# Patient Record
Sex: Male | Born: 1970 | Race: White | Marital: Married | Smoking: Never smoker
Health system: Southern US, Community
[De-identification: ages and names within clinical notes are randomized; demographics above are authoritative.]

## PROBLEM LIST (undated history)

## (undated) DIAGNOSIS — E785 Hyperlipidemia, unspecified: Secondary | ICD-10-CM

## (undated) DIAGNOSIS — M1990 Unspecified osteoarthritis, unspecified site: Secondary | ICD-10-CM

## (undated) DIAGNOSIS — K219 Gastro-esophageal reflux disease without esophagitis: Secondary | ICD-10-CM

## (undated) HISTORY — DX: Unspecified osteoarthritis, unspecified site: M19.90

## (undated) HISTORY — DX: Gastro-esophageal reflux disease without esophagitis: K21.9

## (undated) HISTORY — PX: OTHER SURGICAL HISTORY: SHX169

## (undated) HISTORY — PX: KNEE ARTHROCENTESIS: SUR44

## (undated) HISTORY — DX: Hyperlipidemia, unspecified: E78.5

---

## 2012-05-23 DIAGNOSIS — Z800 Family history of malignant neoplasm of digestive organs: Secondary | ICD-10-CM | POA: Insufficient documentation

## 2012-05-23 DIAGNOSIS — Z8719 Personal history of other diseases of the digestive system: Secondary | ICD-10-CM | POA: Insufficient documentation

## 2012-05-23 DIAGNOSIS — E785 Hyperlipidemia, unspecified: Secondary | ICD-10-CM | POA: Insufficient documentation

## 2012-05-23 DIAGNOSIS — Z833 Family history of diabetes mellitus: Secondary | ICD-10-CM | POA: Insufficient documentation

## 2012-05-23 DIAGNOSIS — Z8249 Family history of ischemic heart disease and other diseases of the circulatory system: Secondary | ICD-10-CM | POA: Insufficient documentation

## 2012-05-23 DIAGNOSIS — Z9889 Other specified postprocedural states: Secondary | ICD-10-CM | POA: Insufficient documentation

## 2013-03-14 HISTORY — PX: VASECTOMY: SHX75

## 2014-05-05 LAB — HM COLONOSCOPY

## 2015-06-04 ENCOUNTER — Ambulatory Visit (INDEPENDENT_AMBULATORY_CARE_PROVIDER_SITE_OTHER): Payer: 59 | Admitting: Family Medicine

## 2015-06-04 ENCOUNTER — Encounter: Payer: Self-pay | Admitting: Family Medicine

## 2015-06-04 VITALS — BP 128/76 | HR 57 | Temp 97.9°F | Resp 16 | Ht 72.0 in | Wt 192.0 lb

## 2015-06-04 DIAGNOSIS — Z7189 Other specified counseling: Secondary | ICD-10-CM

## 2015-06-04 DIAGNOSIS — J0111 Acute recurrent frontal sinusitis: Secondary | ICD-10-CM | POA: Diagnosis not present

## 2015-06-04 DIAGNOSIS — E785 Hyperlipidemia, unspecified: Secondary | ICD-10-CM

## 2015-06-04 DIAGNOSIS — Z800 Family history of malignant neoplasm of digestive organs: Secondary | ICD-10-CM | POA: Diagnosis not present

## 2015-06-04 DIAGNOSIS — Z125 Encounter for screening for malignant neoplasm of prostate: Secondary | ICD-10-CM | POA: Diagnosis not present

## 2015-06-04 DIAGNOSIS — Z7689 Persons encountering health services in other specified circumstances: Secondary | ICD-10-CM

## 2015-06-04 MED ORDER — LEVOFLOXACIN 500 MG PO TABS: 500.0000 mg | ORAL_TABLET | Freq: Every day | ORAL | Status: DC

## 2015-06-04 MED ORDER — PSEUDOEPHEDRINE HCL 60 MG PO TABS: 60.0000 mg | ORAL_TABLET | Freq: Three times a day (TID) | ORAL | Status: DC | PRN

## 2015-06-04 MED ORDER — FLUTICASONE PROPIONATE 50 MCG/ACT NA SUSP: 2.0000 | Freq: Every day | NASAL | Status: DC

## 2015-06-04 NOTE — Patient Instructions (Addendum)
You can use supportive care at home to help with your symptoms. I have sent Mucinex DM to your pharmacy to help break up the congestion and soothe your cough. You can takes this twice daily.  Honey is a natural cough suppressant- so add it to your tea in the morning.  If you have a humidifer, set that up in your bedroom at night.   Take levaquin once daily for 5 days to help with sinus infection. Use mucinex and sudafed to help with your ear.

## 2015-06-04 NOTE — Assessment & Plan Note (Signed)
Diet controlled. Encouraged heart healthy diet and exercise. Recheck lipid panel.

## 2015-06-04 NOTE — Progress Notes (Signed)
Subjective:    Patient ID: Chris Petersen, male    DOB: 1970/06/24, 45 y.o.   MRN: [id number]  HPI: Chris Petersen is a 45 y.o. male presenting on 06/04/2015 for Establish Care   HPI  Pt presents to establish care today. Previous care provider was Therapist, occupational in [place].  It has been 1 year year since His last PCP visit. Records from previous provider will be requested and reviewed. Current medical problems include:  Acid reflux: Occasionl- takes OTC omeprazole Mild hyperlipidemia-  Recent treatment for a sinus infection and ear infection- put on amoxil for since infection- day 6.- Fast Med on Saturday. Had started Z-pak prior. Taking mucinex with little relief. Tried sudafed no relief. Still pain an pressure behind the eyes. Sinus HA. Ear still feels full.   Health maintenance:  Colonoscopy- Dad had colon cancer- dx age 51. Colonoscopy normal 3 years due to family history has screened early.  No family history prostate cancer. But would like to screen today.  Last TDAp - 3 years ago.  Flu shot done this year.  Exercises regularly- races motorcycles. Has a 44 year old.  Works as Adult nurse.    Past Medical History  Diagnosis Date  . GERD (gastroesophageal reflux disease)   . Hyperlipidemia   . Arthritis    Past Surgical History  Procedure Laterality Date  . Knee arthrocentesis      L Knee ( MCL, ACL,PCL ) tear  . Hand accident      Social History   Social History  . Marital Status: Married    Spouse Name: N/A  . Number of Children: N/A  . Years of Education: N/A   Occupational History  . Not on file.   Social History Main Topics  . Smoking status: Never Smoker   . Smokeless tobacco: Not on file  . Alcohol Use: No  . Drug Use: Yes  . Sexual Activity: Not on file   Other Topics Concern  . Not on file   Social History Narrative  . No narrative on file   Family History  Problem Relation Age of Onset  . Cancer Father   . Colon polyps Father     No current outpatient prescriptions on file prior to visit.   No current facility-administered medications on file prior to visit.    Review of Systems  Constitutional: Negative for fever and chills.  HENT: Positive for congestion, ear pain, postnasal drip and sinus pressure.   Respiratory: Negative for chest tightness, shortness of breath and wheezing.   Cardiovascular: Negative for chest pain, palpitations and leg swelling.  Gastrointestinal: Negative for nausea, vomiting and abdominal pain.  Endocrine: Negative.   Genitourinary: Negative for dysuria, urgency, discharge, penile pain and testicular pain.  Musculoskeletal: Negative for back pain, joint swelling and arthralgias.  Skin: Negative.   Neurological: Negative for dizziness, weakness, numbness and headaches.  Psychiatric/Behavioral: Negative for sleep disturbance and dysphoric mood.   Per HPI unless specifically indicated above     Objective:    BP 128/76 mmHg  Pulse 57  Temp(Src) 97.9 F (36.6 C) (Oral)  Resp 16  Ht 6' (1.829 m)  Wt 192 lb (87.091 kg)  BMI 26.03 kg/m2  Wt Readings from Last 3 Encounters:  06/04/15 192 lb (87.091 kg)    Physical Exam  Constitutional: He is oriented to person, place, and time. He appears well-developed and well-nourished. No distress.  HENT:  Head: Normocephalic and atraumatic.  Right Ear: Hearing and tympanic membrane normal.  Left Ear: Hearing normal. A middle ear effusion is present.  Nose: Mucosal edema and rhinorrhea present. Right sinus exhibits frontal sinus tenderness. Left sinus exhibits frontal sinus tenderness.  Mouth/Throat: Mucous membranes are normal. Posterior oropharyngeal erythema present.  Neck: Neck supple. No thyromegaly present.  Cardiovascular: Normal rate, regular rhythm and normal heart sounds.  Exam reveals no gallop and no friction rub.   No murmur heard. Pulmonary/Chest: Effort normal and breath sounds normal. He has no wheezes.  Abdominal: Soft.  Bowel sounds are normal. He exhibits no distension. There is no tenderness. There is no rebound.  Musculoskeletal: Normal range of motion. He exhibits no edema or tenderness.  Neurological: He is alert and oriented to person, place, and time. He has normal reflexes.  Skin: Skin is warm and dry. No rash noted. No erythema.  Psychiatric: He has a normal mood and affect. His behavior is normal. Thought content normal.   No results found for this or any previous visit.    Assessment & Plan:   Problem List Items Addressed This Visit      Other   Family history of colon cancer    Continue screening colonoscopy as directed by GI.       HLD (hyperlipidemia)    Diet controlled. Encouraged heart healthy diet and exercise. Recheck lipid panel.       Relevant Orders   Comprehensive metabolic panel   Lipid Profile    Other Visit Diagnoses    Acute recurrent frontal sinusitis    -  Primary    Change to levaquin since 2 rounds of abx have not cleared symptoms. Trial of sudafed. Add flonase. Return if not improving.     Relevant Medications    pseudoephedrine (SUDAFED) 60 MG tablet    levofloxacin (LEVAQUIN) 500 MG tablet    fluticasone (FLONASE) 50 MCG/ACT nasal spray    Encounter to establish care        Screening for prostate cancer        Reviewed screening guidelines. Pt is very concerned and would like PSA checked. have ordered per patient preference.     Relevant Orders    PSA       Meds ordered this encounter  Medications  . DISCONTD: amoxicillin-clavulanate (AUGMENTIN) 875-125 MG tablet    Sig: Take 1 tablet by mouth 2 (two) times daily.    Refill:  0  . Omega-3 Fatty Acids (FISH OIL CONCENTRATE PO)    Sig: Take by mouth.  . DISCONTD: levofloxacin (LEVAQUIN) 500 MG tablet    Sig: Take 1 tablet (500 mg total) by mouth daily.    Dispense:  5 tablet    Refill:  0    Order Specific Question:  Supervising Provider    Answer:  Arlis Porta [id number]  . DISCONTD:  pseudoephedrine (SUDAFED) 60 MG tablet    Sig: Take 1 tablet (60 mg total) by mouth every 8 (eight) hours as needed for congestion.    Dispense:  30 tablet    Refill:  0    Order Specific Question:  Supervising Provider    Answer:  Arlis Porta [phone]  . pseudoephedrine (SUDAFED) 60 MG tablet    Sig: Take 1 tablet (60 mg total) by mouth every 8 (eight) hours as needed for congestion.    Dispense:  30 tablet    Refill:  0    Order Specific Question:  Supervising Provider    Answer:  Arlis Porta [phone]  .  levofloxacin (LEVAQUIN) 500 MG tablet    Sig: Take 1 tablet (500 mg total) by mouth daily.    Dispense:  5 tablet    Refill:  0    Order Specific Question:  Supervising Provider    Answer:  Arlis Porta [id number]  . fluticasone (FLONASE) 50 MCG/ACT nasal spray    Sig: Place 2 sprays into both nostrils daily.    Dispense:  16 g    Refill:  11    Order Specific Question:  Supervising Provider    Answer:  Arlis Porta [phone]      Follow up plan: No Follow-up on file.

## 2015-06-04 NOTE — Assessment & Plan Note (Signed)
Continue screening colonoscopy as directed by GI.

## 2015-06-17 ENCOUNTER — Telehealth: Payer: Self-pay | Admitting: Family Medicine

## 2015-06-17 DIAGNOSIS — Z3009 Encounter for other general counseling and advice on contraception: Secondary | ICD-10-CM

## 2015-06-17 NOTE — Telephone Encounter (Signed)
Pt needs a referral for vasectomy.  His call back number is [phone]

## 2015-06-18 NOTE — Telephone Encounter (Signed)
Done.[place] 

## 2015-06-18 NOTE — Telephone Encounter (Signed)
Please let him know I have placed a referral to [facility]- they will contact him with appt.

## 2015-06-24 LAB — LIPID PANEL
Chol/HDL Ratio: 3.4 ratio units (ref 0.0–5.0)
Cholesterol, Total: 209 mg/dL — ABNORMAL HIGH (ref 100–199)
HDL: 62 mg/dL (ref >39)
LDL Calculated: 124 mg/dL — ABNORMAL HIGH (ref 0–99)
TRIGLYCERIDES: 114 mg/dL (ref 0–149)
VLDL Cholesterol Cal: 23 mg/dL (ref 5–40)

## 2015-06-24 LAB — COMPREHENSIVE METABOLIC PANEL
A/G RATIO: 1.8 (ref 1.2–2.2)
ALT: 31 IU/L (ref 0–44)
AST: 33 IU/L (ref 0–40)
Albumin: 4.4 g/dL (ref 3.5–5.5)
Alkaline Phosphatase: 51 IU/L (ref 39–117)
BILIRUBIN TOTAL: 0.5 mg/dL (ref 0.0–1.2)
BUN/Creatinine Ratio: 15 (ref 9–20)
BUN: 16 mg/dL (ref 6–24)
CALCIUM: 9.5 mg/dL (ref 8.7–10.2)
CHLORIDE: 100 mmol/L (ref 96–106)
CO2: 25 mmol/L (ref 18–29)
Creatinine, Ser: 1.07 mg/dL (ref 0.76–1.27)
GFR calc Af Amer: 98 mL/min/{1.73_m2} (ref >59)
GFR, EST NON AFRICAN AMERICAN: 85 mL/min/{1.73_m2} (ref >59)
GLUCOSE: 94 mg/dL (ref 65–99)
Globulin, Total: 2.5 g/dL (ref 1.5–4.5)
POTASSIUM: 4.9 mmol/L (ref 3.5–5.2)
Sodium: 142 mmol/L (ref 134–144)
Total Protein: 6.9 g/dL (ref 6.0–8.5)

## 2015-06-24 LAB — PSA: Prostate Specific Ag, Serum: 1 ng/mL (ref 0.0–4.0)

## 2015-07-01 ENCOUNTER — Ambulatory Visit: Payer: Self-pay | Admitting: Urology

## 2015-07-07 ENCOUNTER — Encounter: Payer: Self-pay | Admitting: Family Medicine

## 2015-07-07 ENCOUNTER — Ambulatory Visit (INDEPENDENT_AMBULATORY_CARE_PROVIDER_SITE_OTHER): Payer: 59 | Admitting: Family Medicine

## 2015-07-07 VITALS — BP 117/78 | HR 63 | Temp 98.7°F | Resp 16 | Ht 72.0 in | Wt 191.0 lb

## 2015-07-07 DIAGNOSIS — M659 Synovitis and tenosynovitis, unspecified: Secondary | ICD-10-CM | POA: Diagnosis not present

## 2015-07-07 DIAGNOSIS — K219 Gastro-esophageal reflux disease without esophagitis: Secondary | ICD-10-CM | POA: Diagnosis not present

## 2015-07-07 DIAGNOSIS — IMO0002 Reserved for concepts with insufficient information to code with codable children: Secondary | ICD-10-CM

## 2015-07-07 DIAGNOSIS — G5692 Unspecified mononeuropathy of left upper limb: Secondary | ICD-10-CM | POA: Diagnosis not present

## 2015-07-07 MED ORDER — NAPROXEN 500 MG PO TABS: 500.0000 mg | ORAL_TABLET | Freq: Two times a day (BID) | ORAL | Status: DC

## 2015-07-07 MED ORDER — PANTOPRAZOLE SODIUM 40 MG PO TBEC: 40.0000 mg | DELAYED_RELEASE_TABLET | Freq: Every day | ORAL | Status: DC

## 2015-07-07 NOTE — Assessment & Plan Note (Signed)
Start protonix daily to help with symptoms. Reviewed alarm symptoms. Recheck 1 mos.

## 2015-07-07 NOTE — Progress Notes (Signed)
Subjective:    Patient ID: Chris Petersen, male    DOB: [birth year], 45 y.o.   MRN: [id number]  HPI: Chris Petersen is a 45 y.o. male presenting on 07/07/2015 for Arm Pain   HPI  Pt presents for bilateral forearm pain. Pain has been present x a few months. Can always feel it. Races motorcycles- does gripping. L hand is worse than R. Previously used to bother him when racing but now is aching all the time. Pain is located mainly along forearm radiation along dorsal surface of hands. Only L hand pain. R is just forearm.  Feels like something is smashing the hand. Previously history of trauma to L hand- had multiple surgeries to remove foreign object after archery injury.  Pt report GERD symptoms with use of NSAIDs. Has taken protonix in the past.   Past Medical History  Diagnosis Date  . GERD (gastroesophageal reflux disease)   . Hyperlipidemia   . Arthritis     Current Outpatient Prescriptions on File Prior to Visit  Medication Sig  . Omega-3 Fatty Acids (FISH OIL CONCENTRATE PO) Take by mouth.   No current facility-administered medications on file prior to visit.    Review of Systems  Constitutional: Negative for fever and chills.  HENT: Negative.   Respiratory: Negative for chest tightness, shortness of breath and wheezing.   Cardiovascular: Negative for chest pain, palpitations and leg swelling.  Genitourinary: Negative.   Musculoskeletal: Positive for myalgias and arthralgias. Negative for joint swelling, gait problem, neck pain and neck stiffness.  Allergic/Immunologic: Negative.  Negative for environmental allergies.  Neurological: Positive for numbness. Negative for dizziness and headaches.  Psychiatric/Behavioral: Negative.    Per HPI unless specifically indicated above     Objective:    BP 117/78 mmHg  Pulse 63  Temp(Src) 98.7 F (37.1 C) (Oral)  Resp 16  Ht 6' (1.829 m)  Wt 191 lb (86.637 kg)  BMI 25.90 kg/m2  Wt Readings from Last 3 Encounters:  07/07/15  191 lb (86.637 kg)  06/04/15 192 lb (87.091 kg)    Physical Exam  Cardiovascular: Normal rate and regular rhythm.  Exam reveals no gallop and no friction rub.   No murmur heard. Pulmonary/Chest: Effort normal and breath sounds normal. He has no wheezes. He has no rales.  Musculoskeletal:       Right elbow: Normal.He exhibits normal range of motion and no swelling. No tenderness found.       Left elbow: Normal. He exhibits normal range of motion and no swelling. No tenderness found.       Right wrist: Normal.       Left wrist: Normal.       Right forearm: He exhibits tenderness. He exhibits no swelling.       Left forearm: He exhibits tenderness.       Right hand: Normal.       Left hand: He exhibits normal range of motion, no tenderness, normal two-point discrimination, normal capillary refill and no swelling. Decreased sensation (decrease in sensation to monofilament along palmar surface L hand) noted. Normal strength noted.  Pain along the extensor digitorum both arms. .    Results for orders placed or performed in visit on 06/04/15  Comprehensive metabolic panel  Result Value Ref Range   Glucose 94 65 - 99 mg/dL   BUN 16 6 - 24 mg/dL   Creatinine, Ser 1.07 0.76 - 1.27 mg/dL   GFR calc non Af Amer 85 >59 mL/min/1.73   GFR calc  Af Amer 98 >59 mL/min/1.73   BUN/Creatinine Ratio 15 9 - 20   Sodium 142 134 - 144 mmol/L   Potassium 4.9 3.5 - 5.2 mmol/L   Chloride 100 96 - 106 mmol/L   CO2 25 18 - 29 mmol/L   Calcium 9.5 8.7 - 10.2 mg/dL   Total Protein 6.9 6.0 - 8.5 g/dL   Albumin 4.4 3.5 - 5.5 g/dL   Globulin, Total 2.5 1.5 - 4.5 g/dL   Albumin/Globulin Ratio 1.8 1.2 - 2.2   Bilirubin Total 0.5 0.0 - 1.2 mg/dL   Alkaline Phosphatase 51 39 - 117 IU/L   AST 33 0 - 40 IU/L   ALT 31 0 - 44 IU/L  Lipid Profile  Result Value Ref Range   Cholesterol, Total 209 (H) 100 - 199 mg/dL   Triglycerides 114 0 - 149 mg/dL   HDL 62 >39 mg/dL   VLDL Cholesterol Cal 23 5 - 40 mg/dL   LDL  Calculated 124 (H) 0 - 99 mg/dL   Chol/HDL Ratio 3.4 0.0 - 5.0 ratio units  PSA  Result Value Ref Range   Prostate Specific Ag, Serum 1.0 0.0 - 4.0 ng/mL      Assessment & Plan:   Problem List Items Addressed This Visit      Digestive   Gastroesophageal reflux disease without esophagitis    Start protonix daily to help with symptoms. Reviewed alarm symptoms. Recheck 1 mos.       Relevant Medications   pantoprazole (PROTONIX) 40 MG tablet    Other Visit Diagnoses    Tendonitis of elbow or forearm    -  Primary    Pain is likely related to overuse of extensor muscles due to motorcycle racing. NSAIDs and rest. Consider PT or ortho if symptoms don't improve.     Relevant Medications    naproxen (NAPROSYN) 500 MG tablet    Neuropathy of left hand        Likely 2/2 previous surgeries- symptoms exacerbated on L side due to injury. Consider gabapentin if symptoms don't improve.        Meds ordered this encounter  Medications  . naproxen (NAPROSYN) 500 MG tablet    Sig: Take 1 tablet (500 mg total) by mouth 2 (two) times daily with a meal.    Dispense:  30 tablet    Refill:  1    Order Specific Question:  Supervising Provider    Answer:  Arlis Porta [phone]  . pantoprazole (PROTONIX) 40 MG tablet    Sig: Take 1 tablet (40 mg total) by mouth daily.    Dispense:  30 tablet    Refill:  3    Order Specific Question:  Supervising Provider    Answer:  Arlis Porta [id number]      Follow up plan: Return in about 4 weeks (around 08/04/2015), or if symptoms worsen or fail to improve, for arm pain. Marland Kitchen

## 2015-07-07 NOTE — Patient Instructions (Signed)
I think your pain is tendon related. Try naproxen twice daily for arm pain. Rest- no motorcycles for a little.  We will check in 2-4 weeks on arm pain.

## 2015-07-13 ENCOUNTER — Telehealth: Payer: Self-pay | Admitting: Urology

## 2015-07-13 ENCOUNTER — Ambulatory Visit (INDEPENDENT_AMBULATORY_CARE_PROVIDER_SITE_OTHER): Payer: 59 | Admitting: Urology

## 2015-07-13 ENCOUNTER — Encounter: Payer: Self-pay | Admitting: Urology

## 2015-07-13 VITALS — BP 132/80 | HR 56 | Ht 72.0 in | Wt 194.6 lb

## 2015-07-13 DIAGNOSIS — Z0000 Encounter for general adult medical examination without abnormal findings: Secondary | ICD-10-CM | POA: Insufficient documentation

## 2015-07-13 DIAGNOSIS — Z309 Encounter for contraceptive management, unspecified: Secondary | ICD-10-CM | POA: Diagnosis not present

## 2015-07-13 DIAGNOSIS — Z3009 Encounter for other general counseling and advice on contraception: Secondary | ICD-10-CM

## 2015-07-13 MED ORDER — DIAZEPAM 10 MG PO TABS: ORAL_TABLET | ORAL | Status: DC

## 2015-07-13 NOTE — Progress Notes (Signed)
07/13/2015 2:38 PM   Chris Petersen Dec 10, 1970 [id number]  Referring provider: Luciana Axe, NP [place], [place] [address]  Chief Complaint  Patient presents with  . VAS Consult    referred by [facility]    HPI: Chris Petersen is a 45 year old Caucasian who presents today requesting a vasectomy.  Patient has one child and wishes to end his family unit at this point.  Patient denies any history of chronic prostatitis, epididymitis, orchitis, or other genital pain.  Today, we discussed what the vas deferens is, where it is located, and its function. We reviewed the procedure for vasectomy, it's risks, benefits, alternatives, and likelihood of achieving his goals.   We discussed in detail the procedure, complications, and recovery as well as the need for clearance prior to unprotected intercourse. We discussed that vasectomy does not protect against sexually transmitted diseases. We discussed that this procedure does not result in immediate sterility and that they would need to use other forms of birth control until he has been cleared with a three month negative postvasectomy semen analyses.  I explained that the procedure is considered to be permanent and that attempts at reversal have varying degrees of success. These options include vasectomy reversal, sperm retrieval, and in vitro fertilization; these can be very expensive.   We discussed the chance of postvasectomy pain syndrome which occurs in less than 5% of patients. I explained to the patient that there is no treatment to resolve this chronic pain, and that if it developed I would not be able to help resolve the issue, but that surgery is generally not needed for correction.   I explained there have even been reports of systemic like illness associated with this chronic pain, and that there was no good cure. I explained that vasectomy it is not a 100% reliable form of birth control, and the risk of pregnancy  after vasectomy is approximately 1 in 2000 men who had a negative postvasectomy semen analysis or rare non-motile sperm.  I explained that repeat vasectomy was necessary in less than 1% of vasectomy procedures when employing the type of technique that is performed in the office. I explained that he should refrain from ejaculation for approximately one week following vasectomy. I explained that there are other options for birth control which are permanent and non-permanent; we discussed these.  I explained the rates of surgical complications, such as symptomatic hematoma or infection, are low (1-2%) and vary with the surgeon's experience and criteria used to diagnose the complication.    PNH: Past Medical History  Diagnosis Date  . GERD (gastroesophageal reflux disease)   . Hyperlipidemia   . Arthritis     Surgical History: Past Surgical History  Procedure Laterality Date  . Knee arthrocentesis      L Knee ( MCL, ACL,PCL ) tear  . Hand accident      Home Medications:    Medication List       This list is accurate as of: 07/13/15  2:38 PM.  Always use your most recent med list.               diazepam 10 MG tablet  Commonly known as:  VALIUM  Take one tablet thirty prior to vasectomy     FISH OIL CONCENTRATE PO  Take by mouth.     GLUCOSAMINE PO  Take by mouth.     naproxen 500 MG tablet  Commonly known as:  NAPROSYN  Take 1 tablet (500 mg total) by mouth 2 (two) times daily with a meal.     pantoprazole 40 MG tablet  Commonly known as:  PROTONIX  Take 1 tablet (40 mg total) by mouth daily.        Allergies: No Known Allergies  Family History: Family History  Problem Relation Age of Onset  . Cancer Father     colon  . Colon polyps Father   . Heart disease Father     2010    Social History:  reports that he has never smoked. He does not have any smokeless tobacco history on file. He reports that he drinks alcohol. He reports that he does not use illicit  drugs.  ROS: UROLOGY Frequent Urination?: No Hard to postpone urination?: No Burning/pain with urination?: No Get up at night to urinate?: No Leakage of urine?: No Urine stream starts and stops?: No Trouble starting stream?: No Do you have to strain to urinate?: No Blood in urine?: No Urinary tract infection?: No Sexually transmitted disease?: No Injury to kidneys or bladder?: No Painful intercourse?: No Weak stream?: No Erection problems?: No Penile pain?: No  Gastrointestinal Nausea?: No Vomiting?: No Indigestion/heartburn?: No Diarrhea?: No Constipation?: No  Constitutional Fever: No Night sweats?: No Weight loss?: No Fatigue?: No  Skin Skin rash/lesions?: No Itching?: No  Eyes Blurred vision?: No Double vision?: No  Ears/Nose/Throat Sore throat?: No Sinus problems?: No  Hematologic/Lymphatic Swollen glands?: No Easy bruising?: No  Cardiovascular Leg swelling?: No Chest pain?: No  Respiratory Cough?: No Shortness of breath?: No  Endocrine Excessive thirst?: No  Musculoskeletal Back pain?: No Joint pain?: No  Neurological Headaches?: No Dizziness?: No  Psychologic Depression?: No Anxiety?: No  Physical Exam: BP 132/80 mmHg  Pulse 56  Ht 6' (1.829 m)  Wt 194 lb 9.6 oz (88.27 kg)  BMI 26.39 kg/m2  Constitutional: Well nourished. Alert and oriented, No acute distress. HE ENT: [place] AT, moist mucus membranes. Trachea midline, no masses. Cardiovascular: No clubbing, cyanosis, or edema. Respiratory: Normal respiratory effort, no increased work of breathing. GI: Abdomen is soft, non tender, non distended, no abdominal masses. Liver and spleen not palpable.  No hernias appreciated.  Stool sample for occult testing is not indicated.   GU: No CVA tenderness.  No bladder fullness or masses.  Patient with circumcised phallus.   Urethral meatus is patent.  No penile discharge. No penile lesions or rashes. Scrotum without lesions, cysts, rashes  and/or edema.  Testicles are located scrotally bilaterally. No masses are appreciated in the testicles. Left and right epididymis are normal. Skin: No rashes, bruises or suspicious lesions. Lymph: No cervical or inguinal adenopathy. Neurologic: Grossly intact, no focal deficits, moving all 4 extremities. Psychiatric: Normal mood and affect.  Laboratory Data:  Lab Results  Component Value Date   CREATININE 1.07 06/23/2015      Component Value Date/Time   CHOL 209* 06/23/2015 0909   HDL 62 06/23/2015 0909   CHOLHDL 3.4 06/23/2015 0909   LDLCALC 124* 06/23/2015 0909   Lab Results  Component Value Date   AST 33 06/23/2015   Lab Results  Component Value Date   ALT 31 06/23/2015     Assessment & Plan:     1. Vasectomy consult:  Patient has read and signed the consent.  He is given the pre-op vasectomy instruction sheet.  He is prescribed Valium 10 mg and instructed to take it 30 minutes prior to his vasectomy appointment.  He is to have  a driver.  I reemphasized to the patient that this is to be considered a permanent form of birth control, that he is to use an alternative form of birth control until we receive the 3 months specimen and it is cleared of sperm and that this will not prevent Stik's.  His questions are answered to his satisfaction and he understands the risks and is willing to proceed with the vasectomy.  He will schedule his vasectomy.    I spent 30 minutes with a face to face conversation with the patient concerning the vasectomy procedure, risks and what to expect post procedurally.  Greater than 50% was spent in counseling & coordination of care with the patient.   Return for keep vas appt. .  These notes generated with voice recognition software. I apologize for typographical errors.  Zara Council, [facility] [address], [place], [place] [address] [phone]

## 2015-07-13 NOTE — Telephone Encounter (Signed)
Please call and notify the patient to not take his naproxen 5 days prior to the vasectomy as it may result in greater bruising after the procedure.

## 2015-07-14 NOTE — Telephone Encounter (Signed)
Spoke with pt in reference to naproxen and vasectomy. Pt voiced understanding.

## 2015-07-24 ENCOUNTER — Encounter: Payer: Self-pay | Admitting: Urology

## 2015-08-06 ENCOUNTER — Telehealth: Payer: Self-pay | Admitting: Family Medicine

## 2015-08-06 ENCOUNTER — Ambulatory Visit (INDEPENDENT_AMBULATORY_CARE_PROVIDER_SITE_OTHER): Payer: 59 | Admitting: Family Medicine

## 2015-08-06 VITALS — BP 125/77 | HR 60 | Temp 97.7°F | Resp 16 | Ht 72.0 in | Wt 197.0 lb

## 2015-08-06 DIAGNOSIS — D170 Benign lipomatous neoplasm of skin and subcutaneous tissue of head, face and neck: Secondary | ICD-10-CM | POA: Diagnosis not present

## 2015-08-06 DIAGNOSIS — J0100 Acute maxillary sinusitis, unspecified: Secondary | ICD-10-CM

## 2015-08-06 DIAGNOSIS — R200 Anesthesia of skin: Secondary | ICD-10-CM

## 2015-08-06 DIAGNOSIS — R208 Other disturbances of skin sensation: Secondary | ICD-10-CM

## 2015-08-06 MED ORDER — PSEUDOEPHEDRINE HCL 60 MG PO TABS: 60.0000 mg | ORAL_TABLET | Freq: Three times a day (TID) | ORAL | Status: DC | PRN

## 2015-08-06 MED ORDER — AMOXICILLIN-POT CLAVULANATE 875-125 MG PO TABS: 1.0000 | ORAL_TABLET | Freq: Two times a day (BID) | ORAL | Status: DC

## 2015-08-06 MED ORDER — SALINE SPRAY 0.65 % NA SOLN: 2.0000 | NASAL | Status: DC | PRN

## 2015-08-06 MED ORDER — PREDNISONE 10 MG PO TABS: ORAL_TABLET | ORAL | Status: DC

## 2015-08-06 NOTE — Progress Notes (Signed)
Subjective:    Patient ID: Chris Petersen, male    DOB: Nov 02, 1970, 45 y.o.   MRN: [id number]  HPI: Chris Petersen is a 45 y.o. male presenting on 08/06/2015 for Sinus Problem   HPI  Patient presents today with congestion, throat irritation, runny nose, tightness around eyes, headache. Son sick with the same. Symptoms started Monday but have progressively gotten worse. Denies chest pain, shortness of breath, dizziness or dizziness. Taking flonase and attempted Benadryl but felt hungover.  Intermittent numbness and tingling to hands, worse after gripping something for long periods of time. Tried taking Naproxen without relief. Pt also has a lipoma on neck that was attempted to be removed by dermatology- it was in muscle and told he would need a surgeon referral. Would like to get it removed.   Past Medical History  Diagnosis Date  . GERD (gastroesophageal reflux disease)   . Hyperlipidemia   . Arthritis     Current Outpatient Prescriptions on File Prior to Visit  Medication Sig  . diazepam (VALIUM) 10 MG tablet Take one tablet thirty prior to vasectomy  . Glucosamine HCl (GLUCOSAMINE PO) Take by mouth.  . naproxen (NAPROSYN) 500 MG tablet Take 1 tablet (500 mg total) by mouth 2 (two) times daily with a meal.  . Omega-3 Fatty Acids (FISH OIL CONCENTRATE PO) Take by mouth.  . pantoprazole (PROTONIX) 40 MG tablet Take 1 tablet (40 mg total) by mouth daily.   No current facility-administered medications on file prior to visit.    Review of Systems  Constitutional: Negative for fever, chills, diaphoresis, activity change, appetite change and fatigue.  HENT: Positive for congestion, postnasal drip, rhinorrhea, sinus pressure and sore throat. Negative for ear pain and trouble swallowing.   Eyes: Negative for discharge and redness.  Respiratory: Negative for cough, chest tightness, shortness of breath and wheezing.   Cardiovascular: Negative for chest pain, palpitations and leg swelling.    Gastrointestinal: Negative for nausea, vomiting and abdominal pain.  Endocrine: Negative for cold intolerance and heat intolerance.  Musculoskeletal: Negative for back pain and neck pain.  Skin: Negative for pallor.  Allergic/Immunologic: Negative for food allergies.  Neurological: Positive for numbness and headaches. Negative for dizziness, seizures, syncope, facial asymmetry, weakness and light-headedness.  Hematological: Does not bruise/bleed easily.  Psychiatric/Behavioral: Negative for confusion, sleep disturbance and agitation.   Per HPI unless specifically indicated above     Objective:    BP 125/77 mmHg  Pulse 60  Temp(Src) 97.7 F (36.5 C) (Oral)  Resp 16  Ht 6' (1.829 m)  Wt 197 lb (89.359 kg)  BMI 26.71 kg/m2  SpO2 99%  Wt Readings from Last 3 Encounters:  08/06/15 197 lb (89.359 kg)  07/13/15 194 lb 9.6 oz (88.27 kg)  07/07/15 191 lb (86.637 kg)    Physical Exam  Constitutional: He is oriented to person, place, and time. He appears well-developed and well-nourished. No distress.  HENT:  Head: Normocephalic and atraumatic.  Right Ear: Hearing, tympanic membrane, external ear and ear canal normal. No drainage or tenderness.  Left Ear: Hearing, tympanic membrane, external ear and ear canal normal. No drainage or tenderness.  Nose: Mucosal edema present. Right sinus exhibits frontal sinus tenderness. Right sinus exhibits no maxillary sinus tenderness. Left sinus exhibits frontal sinus tenderness. Left sinus exhibits no maxillary sinus tenderness.  Mouth/Throat: Uvula is midline and mucous membranes are normal. No uvula swelling. Posterior oropharyngeal erythema (mild) present. No oropharyngeal exudate or posterior oropharyngeal edema.  Eyes: Conjunctivae are normal. Right  eye exhibits no discharge. Left eye exhibits no discharge.  Neck: Normal range of motion.  Cardiovascular: Normal rate and normal heart sounds.  Exam reveals no gallop and no friction rub.   No  murmur heard. Pulmonary/Chest: Effort normal and breath sounds normal. No respiratory distress. He has no wheezes. He has no rales. He exhibits no tenderness.  Abdominal: Soft. There is no tenderness.  Musculoskeletal: Normal range of motion.  Lymphadenopathy:       Head (right side): No submandibular and no tonsillar adenopathy present.       Head (left side): No submandibular and no tonsillar adenopathy present.  Neurological: He is alert and oriented to person, place, and time. He has normal strength.  Tingling sensation in hands when gripping  Skin: Skin is warm and dry. No rash noted. He is not diaphoretic. No erythema. No pallor.  Lipoma at the base of the neck on the L side.   Psychiatric: He has a normal mood and affect. His behavior is normal. Judgment and thought content normal.  Nursing note and vitals reviewed.  Results for orders placed or performed in visit on 06/04/15  Comprehensive metabolic panel  Result Value Ref Range   Glucose 94 65 - 99 mg/dL   BUN 16 6 - 24 mg/dL   Creatinine, Ser 1.07 0.76 - 1.27 mg/dL   GFR calc non Af Amer 85 >59 mL/min/1.73   GFR calc Af Amer 98 >59 mL/min/1.73   BUN/Creatinine Ratio 15 9 - 20   Sodium 142 134 - 144 mmol/L   Potassium 4.9 3.5 - 5.2 mmol/L   Chloride 100 96 - 106 mmol/L   CO2 25 18 - 29 mmol/L   Calcium 9.5 8.7 - 10.2 mg/dL   Total Protein 6.9 6.0 - 8.5 g/dL   Albumin 4.4 3.5 - 5.5 g/dL   Globulin, Total 2.5 1.5 - 4.5 g/dL   Albumin/Globulin Ratio 1.8 1.2 - 2.2   Bilirubin Total 0.5 0.0 - 1.2 mg/dL   Alkaline Phosphatase 51 39 - 117 IU/L   AST 33 0 - 40 IU/L   ALT 31 0 - 44 IU/L  Lipid Profile  Result Value Ref Range   Cholesterol, Total 209 (H) 100 - 199 mg/dL   Triglycerides 114 0 - 149 mg/dL   HDL 62 >39 mg/dL   VLDL Cholesterol Cal 23 5 - 40 mg/dL   LDL Calculated 124 (H) 0 - 99 mg/dL   Chol/HDL Ratio 3.4 0.0 - 5.0 ratio units  PSA  Result Value Ref Range   Prostate Specific Ag, Serum 1.0 0.0 - 4.0 ng/mL       Assessment & Plan:   Problem List Items Addressed This Visit    None    Visit Diagnoses    Acute maxillary sinusitis, recurrence not specified    -  Primary    Home treatment of sudafed PRN. Paper abx given in light of holiday weekend. Alarm symptoms reviewed. Return precautions reviewed.     Relevant Medications    amoxicillin-clavulanate (AUGMENTIN) 875-125 MG tablet    pseudoephedrine (SUDAFED) 60 MG tablet    sodium chloride (OCEAN) 0.65 % SOLN nasal spray    predniSONE (DELTASONE) 10 MG tablet    Arm numbness        Trial of prednisone tohelp with arm numbness. Consider ortho if needed.     Relevant Medications    predniSONE (DELTASONE) 10 MG tablet    Lipoma of neck        Pt  would like to have a surgical consult to see if it can be removed.     Relevant Orders    Ambulatory referral to General Surgery       Meds ordered this encounter  Medications  . amoxicillin-clavulanate (AUGMENTIN) 875-125 MG tablet    Sig: Take 1 tablet by mouth 2 (two) times daily.    Dispense:  14 tablet    Refill:  0    Order Specific Question:  Supervising Provider    Answer:  Arlis Porta [phone]  . pseudoephedrine (SUDAFED) 60 MG tablet    Sig: Take 1 tablet (60 mg total) by mouth every 8 (eight) hours as needed.    Dispense:  30 tablet    Refill:  0    Order Specific Question:  Supervising Provider    Answer:  Arlis Porta [phone]  . sodium chloride (OCEAN) 0.65 % SOLN nasal spray    Sig: Place 2 sprays into both nostrils as needed for congestion.    Refill:  0    Order Specific Question:  Supervising Provider    Answer:  Arlis Porta [id number]  . predniSONE (DELTASONE) 10 MG tablet    Sig: Take 6 pills today, 5 pills day 2, 4 pills day 3, 3 pills day 4, 2 pills day 5, 1 pill day 6. STOP.    Dispense:  21 tablet    Refill:  0    Order Specific Question:  Supervising Provider    Answer:  Arlis Porta [id number]      Follow up plan: Return if symptoms  worsen or fail to improve.

## 2015-08-06 NOTE — Telephone Encounter (Signed)
°  Pt call states that the medicine protonix  Was not working he states that he took meds for  35-40 days, he also said that he think he may have a sinus infection. Pt call back  # is [phone]

## 2015-08-06 NOTE — Telephone Encounter (Signed)
Pt is on schedule now.

## 2015-08-06 NOTE — Telephone Encounter (Signed)
Okay. We can try changing to omeprazole 20mg  once daily and adding Zantac 150 twice daily as needed for acid reflux. If symptoms don't improve, he will probably need to see a GI specialist to see if he needs to have an endoscopy to check out his esophagus and stomach.  As for the potential sinus infection-. I usually require an office visit for treatment. Could he come today? We are closed through the weekend. What are his symptoms and how long have they been present? Any fevers?  If he cannot come today, I can a least decide if it is a probable infection and give him suggestions on treatment. If symptoms don't improve, he will need to go to urgent care over the weekend or be seen on Tuesday.

## 2015-08-06 NOTE — Patient Instructions (Signed)
You can use supportive care at home to help with your symptoms. I have sent Mucinex DM to your pharmacy to help break up the congestion and soothe your cough. You can takes this twice daily.  I have also sent tesslon perles to your pharmacy to help with the cough- you can take these 3 times daily as needed. Honey is a natural cough suppressant- so add it to your tea in the morning.  If you have a humidifer, set that up in your bedroom at night.   Your symptoms are consistent with a viral upper respiratory infection. At this time there is no need for antibiotics.  If your symptoms persist for > 10 days or get better and than worsen please let me know. You may have a secondary bacterial infection.  Fill antibiotics on Saturday if symptoms don't improve.

## 2015-08-07 ENCOUNTER — Encounter: Payer: Self-pay | Admitting: General Surgery

## 2015-08-14 ENCOUNTER — Telehealth: Payer: Self-pay | Admitting: Family Medicine

## 2015-08-14 ENCOUNTER — Encounter: Payer: Self-pay | Admitting: Urology

## 2015-08-14 MED ORDER — LEVOFLOXACIN 750 MG PO TABS: 750.0000 mg | ORAL_TABLET | Freq: Every day | ORAL | Status: DC

## 2015-08-14 NOTE — Telephone Encounter (Signed)
Sent in levaquin to pharmacy on file. Pt alerted.

## 2015-08-14 NOTE — Telephone Encounter (Addendum)
Patient states he would prefer a different abx rather than extension. He says he has only had a 2% improvement. He has has HA on Augmentin. He would like to try levaquin

## 2015-08-14 NOTE — Telephone Encounter (Signed)
Are symptoms not improved at all? Or just not completely better? If he had improvement on Augmentin, I will extend the course- some folks needs a longer course. If he had no improvement, we can try levaquin 500 once daily for 5 days. Thanks! AK

## 2015-08-14 NOTE — Telephone Encounter (Signed)
Pt called states that he came in for a sinus infection  Was not getting better pt call back # [phone]

## 2015-08-14 NOTE — Addendum Note (Signed)
Addended byVincenza Hews, AMY L on: 08/14/2015 05:12 PM   Modules accepted: Orders

## 2015-08-21 ENCOUNTER — Ambulatory Visit (INDEPENDENT_AMBULATORY_CARE_PROVIDER_SITE_OTHER): Payer: 59 | Admitting: Urology

## 2015-08-21 ENCOUNTER — Encounter: Payer: Self-pay | Admitting: Urology

## 2015-08-21 VITALS — BP 145/83 | HR 72 | Ht 72.0 in | Wt 193.0 lb

## 2015-08-21 DIAGNOSIS — Z302 Encounter for sterilization: Secondary | ICD-10-CM | POA: Diagnosis not present

## 2015-08-21 NOTE — Progress Notes (Signed)
08/21/2015   CC: Desires vasectomy  HPI:  45 year old male who desires vasectomy. He has a 42.30 half-year-old child and desires no further biological children. Risks and benefits are previously reviewed and revisited again today. Consent was previously signed. All his questions were answered.  Blood pressure 145/83, pulse 72, height 6' (1.829 m), weight 193 lb (87.544 kg). Physical Exam  Constitutional: He is oriented to person, place, and time. He appears well-developed and well-nourished.  HENT:  Head: Normocephalic and atraumatic.  Cardiovascular: Normal rate.   Abdominal: Soft.  Genitourinary: Penis normal.  Bilateral descended testicles, vasa easily palpable  Neurological: He is alert and oriented to person, place, and time.  Skin: Skin is warm and dry.     Bilateral Vasectomy Procedure  Pre-Procedure: - Patient's scrotum was prepped and draped for vasectomy. - The vas was palpated through the scrotal skin on the left. - 1% Xylocaine was injected into the skin and surrounding tissue for placement  - In a similar manner, the vas on the right was identified, anesthetized, and stabilized.  Procedure: - A bladeless technique was used to open the overlying skin (sharp dissection) - The left vas was isolated and brought up through the incision exposing that structure. - Bleeding points were cauterized as they occurred. - The vas was free from the surrounding structures and brought to the view. - A segment was positioned for placement with a hemostat. - A second hemostat was placed and a small segment between the two hemostats and was removed for inspection. - Each end of the transected vas lumen was fulgurated/ obliterated using needlepoint electrocautery -A fascial interposition was performed on testicular end of the vas using #3-0 chromic suture -The same procedure was performed on the right. - A single suture of #3-0 chromic catgut was used to close each lateral scrotal skin  incision - A dressing was applied.  Post-Procedure: - Patient was instructed in care of the operative area - A specimen is to be delivered in 12 weeks   -Another form of contraception is to be used until    Hollice Espy, MD

## 2015-08-28 ENCOUNTER — Encounter: Payer: Self-pay | Admitting: *Deleted

## 2015-08-31 ENCOUNTER — Encounter: Payer: Self-pay | Admitting: General Surgery

## 2015-08-31 ENCOUNTER — Ambulatory Visit (INDEPENDENT_AMBULATORY_CARE_PROVIDER_SITE_OTHER): Payer: 59 | Admitting: General Surgery

## 2015-08-31 VITALS — BP 124/80 | HR 78 | Resp 14 | Ht 72.0 in | Wt 197.0 lb

## 2015-08-31 DIAGNOSIS — D170 Benign lipomatous neoplasm of skin and subcutaneous tissue of head, face and neck: Secondary | ICD-10-CM

## 2015-08-31 NOTE — Patient Instructions (Signed)
Patient to return as needed. 

## 2015-08-31 NOTE — Progress Notes (Signed)
Patient ID: Chris Petersen, male   DOB: June 22, 1970, 45 y.o.   MRN: [id number]  Chief Complaint  Patient presents with  . Lipoma    HPI Chris Petersen is a 45 y.o. male here today for a evaluation of a lipoma on left ncek. Patient states he first noticed this area about four years ago. No change in size. He was seen three years ago at an doctor office where they tried to do an excision, but the lipoma was attached to the muscle.They did a MRI at that time. He states he is having some numbness in his elbow radiating down to his third through fifth digits.  I have reviewed the history of present illness with the patient.  HPI  Past Medical History  Diagnosis Date  . GERD (gastroesophageal reflux disease)   . Hyperlipidemia   . Arthritis     Past Surgical History  Procedure Laterality Date  . Knee arthrocentesis      L Knee ( MCL, ACL,PCL ) tear  . Hand accident    . Vasectomy  2015    Family History  Problem Relation Age of Onset  . Cancer Father     colon  . Colon polyps Father   . Heart disease Father     2010    Social History Social History  Substance Use Topics  . Smoking status: Never Smoker   . Smokeless tobacco: Never Used  . Alcohol Use: 0.0 oz/week    0 Standard drinks or equivalent per week     Comment: occ    No Known Allergies  Current Outpatient Prescriptions  Medication Sig Dispense Refill  . BIOTIN PO Take by mouth.    . diazepam (VALIUM) 10 MG tablet Take one tablet thirty prior to vasectomy 1 tablet 0  . Glucosamine HCl (GLUCOSAMINE PO) Take by mouth.    . Omega-3 Fatty Acids (FISH OIL CONCENTRATE PO) Take by mouth.    . pantoprazole (PROTONIX) 40 MG tablet Take 1 tablet (40 mg total) by mouth daily. 30 tablet 3   No current facility-administered medications for this visit.    Review of Systems Review of Systems  Constitutional: Negative.   Respiratory: Negative.   Cardiovascular: Negative.     Blood pressure 124/80, pulse 78, resp.  rate 14, height 6' (1.829 m), weight 197 lb (89.359 kg).  Physical Exam Physical Exam  Constitutional: He is oriented to person, place, and time. He appears well-developed and well-nourished.  Eyes: Conjunctivae are normal. No scleral icterus.  Neck: Neck supple.  Lymphadenopathy:    He has no cervical adenopathy.  Neurological: He is alert and oriented to person, place, and time.  Skin: Skin is warm and dry.       Data Reviewed Notes reviewed   Assessment    Lipoma on left neck.  Left forearm symptoms appear to be unrelated to lipoma of the neck.  Given the small size and no local symptoms, excision not recommended.     Plan    Patient to return as needed for lipoma.  Recommend evaluation paresthesias of left arm and hand by orthopedics.     PCP:  Luciana Axe This information has been scribed by Gaspar Cola CMA.     SANKAR,SEEPLAPUTHUR G 08/31/2015, 4:19 PM

## 2015-09-02 ENCOUNTER — Encounter: Payer: Self-pay | Admitting: General Surgery

## 2015-10-28 ENCOUNTER — Other Ambulatory Visit: Payer: 59

## 2015-10-28 DIAGNOSIS — Z3009 Encounter for other general counseling and advice on contraception: Secondary | ICD-10-CM

## 2015-10-29 LAB — POST-VAS SPERM EVALUATION,QUAL: SEMEN VOLUME: 2.1 mL

## 2015-10-30 ENCOUNTER — Telehealth: Payer: Self-pay

## 2015-10-30 NOTE — Telephone Encounter (Signed)
-----   Message from Hollice Espy, MD sent at 10/29/2015  2:44 PM EDT ----- SPERM STILL PRESENT!  It only been a little over 2 months and it generally wait 3 full months for this reason. As such, I did recommend repeating in about a month. Encourage him to ejaculate more frequently to clear his tubes. He should continue to use alternative forms of birth control in the interim.   Hollice Espy, MD

## 2015-10-30 NOTE — Telephone Encounter (Signed)
Spoke with pt in reference to post vas sample. Made aware to continue using birth control and provide another sample in 25mo. Pt voiced understanding.

## 2015-11-01 ENCOUNTER — Other Ambulatory Visit: Payer: Self-pay | Admitting: Family Medicine

## 2015-11-01 DIAGNOSIS — K219 Gastro-esophageal reflux disease without esophagitis: Secondary | ICD-10-CM

## 2015-12-01 ENCOUNTER — Other Ambulatory Visit: Payer: 59

## 2015-12-01 DIAGNOSIS — Z9852 Vasectomy status: Secondary | ICD-10-CM

## 2015-12-02 LAB — POST-VAS SPERM EVALUATION,QUAL: Volume: 2.3 mL

## 2015-12-03 ENCOUNTER — Telehealth: Payer: Self-pay | Admitting: Urology

## 2015-12-03 ENCOUNTER — Other Ambulatory Visit: Payer: Self-pay

## 2015-12-03 DIAGNOSIS — Z9852 Vasectomy status: Secondary | ICD-10-CM

## 2015-12-03 NOTE — Telephone Encounter (Signed)
Spoke with [facility] and referred to online test menu there is a Gaffer that can check motility  CPT code# [id number] Test [phone] Order placed

## 2015-12-03 NOTE — Telephone Encounter (Signed)
Spoke with patient and notified him of results. Patient will come by the office at his earliest convenience for another specimen to check on motility of sperm rather than count.  A basic semen analysis was placed as a future order , please write on requistion when printed to add motility to the order or call the office if a different test should be ordered, thanks

## 2015-12-03 NOTE — Telephone Encounter (Signed)
Called patient, LMOM to return my call.    Second post vas semen analysis positive for sperm, however, this assay is looks for complete absence of sperm and by definition, vasectomy can be considered successful if there are no motile sperm.  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    As such, I would like him to do [place] test # [id number], CPT [phone], post vasectomy semenalysis with quantitative and qualitative motility evaluation.  This test has special instructions on when and where it can be collected.     Hollice Espy, MD

## 2016-02-25 ENCOUNTER — Other Ambulatory Visit: Payer: Self-pay | Admitting: Family Medicine

## 2016-02-25 DIAGNOSIS — K219 Gastro-esophageal reflux disease without esophagitis: Secondary | ICD-10-CM

## 2016-02-25 MED ORDER — PANTOPRAZOLE SODIUM 40 MG PO TBEC: 40.0000 mg | DELAYED_RELEASE_TABLET | Freq: Every day | ORAL | 3 refills | Status: DC

## 2016-05-02 ENCOUNTER — Other Ambulatory Visit: Payer: 59

## 2016-05-06 ENCOUNTER — Ambulatory Visit (INDEPENDENT_AMBULATORY_CARE_PROVIDER_SITE_OTHER): Payer: No Typology Code available for payment source | Admitting: Primary Care

## 2016-05-06 ENCOUNTER — Encounter: Payer: Self-pay | Admitting: Primary Care

## 2016-05-06 VITALS — BP 116/74 | HR 63 | Temp 98.1°F | Ht 72.0 in | Wt 190.8 lb

## 2016-05-06 DIAGNOSIS — Z0000 Encounter for general adult medical examination without abnormal findings: Secondary | ICD-10-CM

## 2016-05-06 DIAGNOSIS — L309 Dermatitis, unspecified: Secondary | ICD-10-CM | POA: Diagnosis not present

## 2016-05-06 DIAGNOSIS — E785 Hyperlipidemia, unspecified: Secondary | ICD-10-CM | POA: Diagnosis not present

## 2016-05-06 DIAGNOSIS — K219 Gastro-esophageal reflux disease without esophagitis: Secondary | ICD-10-CM | POA: Diagnosis not present

## 2016-05-06 MED ORDER — PANTOPRAZOLE SODIUM 20 MG PO TBEC: 20.0000 mg | DELAYED_RELEASE_TABLET | Freq: Every day | ORAL | 1 refills | Status: DC

## 2016-05-06 MED ORDER — TRIAMCINOLONE ACETONIDE 0.1 % EX CREA: 1.0000 "application " | TOPICAL_CREAM | Freq: Two times a day (BID) | CUTANEOUS | 0 refills | Status: DC

## 2016-05-06 NOTE — Progress Notes (Signed)
Subjective:    Patient ID: Chris Petersen, male    DOB: 1970/11/12, 46 y.o.   MRN: [id number]  HPI  Mr. Chris Petersen is a 46 year old male who presents today to establish care, discuss the problems mentioned below, and for complete physical. Will obtain old records.  1) GERD: Currently managed on pantoprazole 40 mg for which he's taken for years. He's tried Nexium and Prilosec without improvement. He has tried coming of this medication in the past but was unsuccessful. He will experience symptoms of esophageal burning and belching when off of his medication.   2) Hyperlipidemia: Lipids in April 2017 slightly above goal. He has been working to make improvements in his diet. He is taking Fish Oil.   3) Eczema: History of this in the past and was treated with a cream with resolve. Recent flare to the left lower extremity that has been intermittent for the past 6 months. His rash is itchy, not painful. No improvement with OTC creams.   Immunizations: -Tetanus: Completed in 2015 -Influenza: Completed this month   Diet: He endorses a healthy diet Breakfast: English muffin, almond butter, fruit Lunch: Wrap, sub sandwich, occasional burger. Dinner: [employer], vegetables Snacks: None Desserts: None Beverages: Water, green tea, coffee  Exercise: He does not currently exercise but is active. Eye exam: Completed 1-2 years ago.  Dental exam: Due, plans on scheduling.  Colonoscopy: Completed 4 years ago, due next year. Endoscopy 4 years ago. Family history, father diagnosed at 3 PSA: Normal in 06/2015     Review of Systems  Constitutional: Negative for unexpected weight change.  HENT: Negative for rhinorrhea.   Respiratory: Negative for cough and shortness of breath.   Cardiovascular: Negative for chest pain.  Gastrointestinal: Negative for constipation and diarrhea.  Genitourinary: Negative for difficulty urinating.  Musculoskeletal: Negative for arthralgias and myalgias.  Skin:  Positive for rash.  Allergic/Immunologic: Negative for environmental allergies.  Neurological: Negative for dizziness, numbness and headaches.  Psychiatric/Behavioral:       Denies concerns for anxiety or depression.        Past Medical History:  Diagnosis Date  . Arthritis   . GERD (gastroesophageal reflux disease)   . Hyperlipidemia      Social History   Social History  . Marital status: Married    Spouse name: N/A  . Number of children: N/A  . Years of education: N/A   Occupational History  . Not on file.   Social History Main Topics  . Smoking status: Never Smoker  . Smokeless tobacco: Never Used  . Alcohol use 0.0 oz/week     Comment: occ  . Drug use: No  . Sexual activity: Not on file   Other Topics Concern  . Not on file   Social History Narrative  . No narrative on file    Past Surgical History:  Procedure Laterality Date  . hand accident    . KNEE ARTHROCENTESIS     L Knee ( MCL, ACL,PCL ) tear  . VASECTOMY  2015    Family History  Problem Relation Age of Onset  . Cancer Father     colon  . Colon polyps Father   . Heart disease Father     2010    No Known Allergies  Current Outpatient Prescriptions on File Prior to Visit  Medication Sig Dispense Refill  . BIOTIN PO Take by mouth.    . Glucosamine HCl (GLUCOSAMINE PO) Take by mouth.    . Omega-3 Fatty  Acids (FISH OIL CONCENTRATE PO) Take by mouth.     No current facility-administered medications on file prior to visit.     BP 116/74   Pulse 63   Temp 98.1 F (36.7 C) (Oral)   Ht 6' (1.829 m)   Wt 190 lb 12.8 oz (86.5 kg)   SpO2 98%   BMI 25.88 kg/m    Objective:   Physical Exam  Constitutional: He is oriented to person, place, and time. He appears well-nourished.  HENT:  Right Ear: Tympanic membrane and ear canal normal.  Left Ear: Tympanic membrane and ear canal normal.  Nose: Nose normal. Right sinus exhibits no maxillary sinus tenderness and no frontal sinus tenderness.  Left sinus exhibits no maxillary sinus tenderness and no frontal sinus tenderness.  Mouth/Throat: Oropharynx is clear and moist.  Eyes: Conjunctivae and EOM are normal. Pupils are equal, round, and reactive to light.  Neck: Neck supple. Carotid bruit is not present. No thyromegaly present.  Cardiovascular: Normal rate, regular rhythm and normal heart sounds.   Pulmonary/Chest: Effort normal and breath sounds normal. He has no wheezes. He has no rales.  Abdominal: Soft. Bowel sounds are normal. There is no tenderness.  Musculoskeletal: Normal range of motion.  Neurological: He is alert and oriented to person, place, and time. He has normal reflexes. No cranial nerve deficit.  Skin: Skin is warm and dry.  Mild to moderate raised rash, mild erythema, mild scaling. Representative of eczema.  Psychiatric: He has a normal mood and affect.          Assessment & Plan:

## 2016-05-06 NOTE — Patient Instructions (Addendum)
We've reduced your pantoprazole from 40 mg to 20 mg. Take 1 tablet by mouth daily. Please notify me if you cannot tolerate the lower dose.  I sent a prescription for triamcinolone cream to your pharmacy for the eczema. Apply the cream twice daily. Wash your hands after use.  Schedule a lab only appointment to return fasting for your labs. You may have water and black coffee.  Continue your efforts towards a healthy lifestyle.  Increase consumption of fresh vegetables and fruits, whole grains, water.  Ensure you are drinking 64 ounces of water daily.  Start exercising. You should be getting 150 minutes of moderate intensity exercise weekly.  Follow up in 1 year for an annual exam or sooner if needed.  It was a pleasure to meet you today! Please don't hesitate to call me with any questions. Welcome to [facility]!

## 2016-05-06 NOTE — Progress Notes (Signed)
Pre visit review using our clinic review tool, if applicable. No additional management support is needed unless otherwise documented below in the visit note. 

## 2016-05-07 NOTE — Assessment & Plan Note (Signed)
Immunizations UTD. PSA UTD. Discussed the importance of a healthy diet and regular exercise in order for weight loss, and to reduce the risk of other medical diseases. Exam with eczema skin rash, otherwise unremarkable. Labs pending. Follow up in 1 year for annual exam.

## 2016-05-07 NOTE — Assessment & Plan Note (Signed)
Long history of, managed on high dose PPI. Will trial him on the 20 mg dose of pantoprazole to see if this is enough to manage symptoms. He will update if symptoms return.

## 2016-05-07 NOTE — Assessment & Plan Note (Signed)
Lipids pending. Overall controls with diet and exercise. Continue Fish Oil.

## 2016-05-10 ENCOUNTER — Telehealth: Payer: Self-pay | Admitting: Radiology

## 2016-05-10 ENCOUNTER — Other Ambulatory Visit: Payer: 59

## 2016-05-10 ENCOUNTER — Other Ambulatory Visit (INDEPENDENT_AMBULATORY_CARE_PROVIDER_SITE_OTHER): Payer: No Typology Code available for payment source

## 2016-05-10 DIAGNOSIS — Z9852 Vasectomy status: Secondary | ICD-10-CM

## 2016-05-10 DIAGNOSIS — E785 Hyperlipidemia, unspecified: Secondary | ICD-10-CM

## 2016-05-10 LAB — COMPREHENSIVE METABOLIC PANEL
ALBUMIN: 4.2 g/dL (ref 3.5–5.2)
ALT: 27 U/L (ref 0–53)
AST: 22 U/L (ref 0–37)
Alkaline Phosphatase: 50 U/L (ref 39–117)
BUN: 16 mg/dL (ref 6–23)
CHLORIDE: 104 meq/L (ref 96–112)
CO2: 31 meq/L (ref 19–32)
CREATININE: 1.17 mg/dL (ref 0.40–1.50)
Calcium: 9.6 mg/dL (ref 8.4–10.5)
GFR: 71.83 mL/min (ref >60.00)
GLUCOSE: 98 mg/dL (ref 70–99)
Potassium: 4.7 mEq/L (ref 3.5–5.1)
SODIUM: 141 meq/L (ref 135–145)
Total Bilirubin: 0.4 mg/dL (ref 0.2–1.2)
Total Protein: 6.7 g/dL (ref 6.0–8.3)

## 2016-05-10 LAB — LIPID PANEL
CHOL/HDL RATIO: 4
CHOLESTEROL: 202 mg/dL — AB (ref 0–200)
HDL: 48.9 mg/dL (ref >39.00)
LDL CALC: 133 mg/dL — AB (ref 0–99)
NONHDL: 153.15
Triglycerides: 99 mg/dL (ref 0.0–149.0)
VLDL: 19.8 mg/dL (ref 0.0–40.0)

## 2016-05-10 NOTE — Telephone Encounter (Signed)
I will have to see him in person in order to determine if it can be taken off. If he'd like to stop by the lobby any day between 1pm-2 pm, or Wedneday after 5 pm (as the office will be open in the front) I can take a quick look, he can then schedule an appointment for removal if it's something I can remove. Let me know when he can drop by.

## 2016-05-10 NOTE — Telephone Encounter (Signed)
Pt came for a lab appt. He mentioned that he saw you a few days ago and forgot to mention a lump on the back of his neck. He would like that removed. Please call to discuss

## 2016-05-11 NOTE — Telephone Encounter (Signed)
Spoken to patient and he stated he will come by on Friday if that's okay.

## 2016-05-11 NOTE — Telephone Encounter (Signed)
Friday sounds great.

## 2016-05-12 LAB — POST-VAS SPERM EVALUATION,QUAL: Volume: 1.5 mL

## 2016-05-12 NOTE — Telephone Encounter (Signed)
Can you follow up with [facility]? It looks like possibly wrong study was done.  Perhaps, there is separate report with number and motility which is what I am looking for...  Hollice Espy, MD

## 2016-05-13 ENCOUNTER — Telehealth: Payer: Self-pay

## 2016-05-13 NOTE — Telephone Encounter (Signed)
Spoke with pt in reference to positive vas samples. Per Dr. Erlene Quan pt should come into the office next week and get orders and cup to take to [facility] for more detailed analysis. Made pt aware. Answered pt questions. Pt voiced understanding.

## 2016-05-19 NOTE — Telephone Encounter (Signed)
Order was left at the front for patient to pick up

## 2016-05-25 ENCOUNTER — Telehealth: Payer: Self-pay

## 2016-05-25 DIAGNOSIS — L309 Dermatitis, unspecified: Secondary | ICD-10-CM

## 2016-05-25 MED ORDER — BETAMETHASONE DIPROPIONATE 0.05 % EX CREA: TOPICAL_CREAM | Freq: Two times a day (BID) | CUTANEOUS | 0 refills | Status: DC

## 2016-05-25 NOTE — Telephone Encounter (Signed)
Please notify patient that i've sent in a prescription for a stronger cream to his pharmacy. Apply twice daily. Do not apply to face, wash hands after use.

## 2016-05-25 NOTE — Telephone Encounter (Signed)
Spoken and notified patient of Kate's comments. Patient verbalized understanding. 

## 2016-05-25 NOTE — Telephone Encounter (Signed)
Pt left /vm; pt was seen 05/06/16 and was given triamcinolone cream for eczema on leg. Eczema is no better; itching is quite bad and wants to know if can get different cream. [facility].

## 2016-06-08 ENCOUNTER — Other Ambulatory Visit: Payer: Self-pay | Admitting: Urology

## 2016-06-08 LAB — SEMEN ANALYSIS, BASIC
AGGLUTINATION: NONE SEEN
Appearance: NORMAL
Bacteria: NONE SEEN /hpf
DEBRIS: NONE SEEN /HPF
EPITHELIAL CELLS: NONE SEEN /HPF
NON-SPECIFIC AGGREGATION: NONE SEEN
RBC: NONE SEEN /hpf
Time Collected: 940
Time Received: 1001
Time Since Last Emission: 48 hours
Trichomonas: NONE SEEN /hpf
VOLUME: 2.9 mL (ref >1.4)

## 2016-06-08 LAB — PLEASE NOTE

## 2016-06-13 ENCOUNTER — Telehealth: Payer: Self-pay | Admitting: *Deleted

## 2016-06-13 NOTE — Telephone Encounter (Signed)
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  please let him know that he may now rely on his vasectomy for birth control method.  Hollice Espy, MD

## 2016-06-13 NOTE — Telephone Encounter (Signed)
Spoke with patient and gave message. Patient is requesting a letter sent to him that states that so he can give it to his wife. I let patient know that I would and he requested to have it mailed to his home. Done.

## 2016-06-13 NOTE — Telephone Encounter (Signed)
LMOM for patient to call office back. 

## 2016-07-03 ENCOUNTER — Other Ambulatory Visit: Payer: Self-pay | Admitting: Family Medicine

## 2016-07-03 DIAGNOSIS — K219 Gastro-esophageal reflux disease without esophagitis: Secondary | ICD-10-CM

## 2016-08-05 ENCOUNTER — Ambulatory Visit (INDEPENDENT_AMBULATORY_CARE_PROVIDER_SITE_OTHER): Payer: No Typology Code available for payment source | Admitting: Primary Care

## 2016-08-05 ENCOUNTER — Encounter: Payer: Self-pay | Admitting: Primary Care

## 2016-08-05 VITALS — BP 118/74 | HR 65 | Temp 98.5°F | Ht 72.0 in | Wt 191.1 lb

## 2016-08-05 DIAGNOSIS — Z87898 Personal history of other specified conditions: Secondary | ICD-10-CM

## 2016-08-05 DIAGNOSIS — L309 Dermatitis, unspecified: Secondary | ICD-10-CM | POA: Diagnosis not present

## 2016-08-05 DIAGNOSIS — D170 Benign lipomatous neoplasm of skin and subcutaneous tissue of head, face and neck: Secondary | ICD-10-CM

## 2016-08-05 DIAGNOSIS — D179 Benign lipomatous neoplasm, unspecified: Secondary | ICD-10-CM | POA: Insufficient documentation

## 2016-08-05 MED ORDER — CLOBETASOL PROPIONATE 0.05 % EX OINT: 1.0000 "application " | TOPICAL_OINTMENT | Freq: Two times a day (BID) | CUTANEOUS | 1 refills | Status: DC

## 2016-08-05 MED ORDER — SCOPOLAMINE 1 MG/3DAYS TD PT72: MEDICATED_PATCH | TRANSDERMAL | 1 refills | Status: DC

## 2016-08-05 NOTE — Progress Notes (Signed)
Subjective:    Patient ID: Chris Petersen, male    DOB: 02-Oct-1970, 46 y.o.   MRN: [id number]  HPI Mr. Criado is a 46 year old male who presents today with multiple complains.  1) Rash: History of eczema for years. Treated with Triamcinolon 0.1% cream initially last visit without improvement, therefore, betamethasone cream was prescribed.   Since his last visit the rash has improved and is less itchy but has not resolved. He is out of the cream. Years prior his rash would completely resolve with topical steroids.  2) Neck Mass: Located to left lower neck that he first noticed 4-5 years ago. He saw a dermatologist at that time who tried to remove the mass. The dermatologist attempted to remove the mass but couldn't as the mass was underneath muscle tissue. The dermatologist wanted to get a MRI but he could not afford an MRI at the time, and then the patient moved shortly after. The mass does cause discomfort intermittently. He denies changes in color and size. He is interested in having the mass removed.  3) Motion Sickness: Intermittent for years. He's planning on going on several fishing trips this summer and will need treatment. He's used the scopolamine patches in the past with relief.   Review of Systems  Skin: Positive for rash.       Neck mass to left neck  Neurological:       History of motion sickness       Past Medical History:  Diagnosis Date  . Arthritis   . GERD (gastroesophageal reflux disease)   . Hyperlipidemia      Social History   Social History  . Marital status: Married    Spouse name: N/A  . Number of children: N/A  . Years of education: N/A   Occupational History  . Not on file.   Social History Main Topics  . Smoking status: Never Smoker  . Smokeless tobacco: Never Used  . Alcohol use 0.0 oz/week     Comment: occ  . Drug use: No  . Sexual activity: Not on file   Other Topics Concern  . Not on file   Social History Narrative  . No  narrative on file    Past Surgical History:  Procedure Laterality Date  . hand accident    . KNEE ARTHROCENTESIS     L Knee ( MCL, ACL,PCL ) tear  . VASECTOMY  2015    Family History  Problem Relation Age of Onset  . Cancer Father        colon  . Colon polyps Father   . Heart disease Father        2010    No Known Allergies  Current Outpatient Prescriptions on File Prior to Visit  Medication Sig Dispense Refill  . BIOTIN PO Take by mouth.    . Glucosamine HCl (GLUCOSAMINE PO) Take by mouth.    . Omega-3 Fatty Acids (FISH OIL CONCENTRATE PO) Take by mouth.    . pantoprazole (PROTONIX) 20 MG tablet Take 1 tablet (20 mg total) by mouth daily. 90 tablet 1  . triamcinolone cream (KENALOG) 0.1 % Apply 1 application topically 2 (two) times daily. 30 g 0   No current facility-administered medications on file prior to visit.     BP 118/74   Pulse 65   Temp 98.5 F (36.9 C) (Oral)   Ht 6' (1.829 m)   Wt 191 lb 1.9 oz (86.7 kg)   SpO2 98%  BMI 25.92 kg/m    Objective:   Physical Exam  Constitutional: He appears well-nourished.  Neck: Neck supple.  Cardiovascular: Normal rate and regular rhythm.   Pulmonary/Chest: Effort normal and breath sounds normal.  Skin: Skin is warm and dry.  Mild, flesh colored, raised, scaly rash to left lateral extremity proximal to ankle. Non tender. No erythema.  2cmx2cm raised soft mass to left lower neck. Non tender. No erythema.          Assessment & Plan:

## 2016-08-05 NOTE — Assessment & Plan Note (Signed)
Rx for scopolamine patches sent to pharmacy. He's done well on these before.

## 2016-08-05 NOTE — Assessment & Plan Note (Signed)
Located to left neck x 4-5 years. Exam today with evidence of benign lipoma to site of concern. Suspect this will have to be surgically removed per general surgery, he will call when ready to have this done.

## 2016-08-05 NOTE — Assessment & Plan Note (Signed)
Overall improved with betamethasone cream, no complete resolve. Will try clobetasol ointment. He will update.

## 2016-08-05 NOTE — Patient Instructions (Signed)
Apply the clobetasol ointment twice daily to affected area. Do not apply to face.  Use the scopolamine patch as needed during fishing trips.  Please message me when you are ready to see the surgeon for removal of the cyst.  It was a pleasure to see you today!

## 2016-11-16 ENCOUNTER — Telehealth: Payer: Self-pay

## 2016-11-16 DIAGNOSIS — L309 Dermatitis, unspecified: Secondary | ICD-10-CM

## 2016-11-16 MED ORDER — CLOBETASOL PROPIONATE 0.05 % EX OINT: 1.0000 "application " | TOPICAL_OINTMENT | Freq: Two times a day (BID) | CUTANEOUS | 1 refills | Status: DC

## 2016-11-16 NOTE — Telephone Encounter (Signed)
Pt said in 07/2016 when clobetasol ointment was sent to [facility] pts ins did not cover med and pt did not pick it up. Pt now has [employer] and will cover med. I spoke with Danae Chen at [facility] and she said they have never received Clobetasol. Resent [facility] to [facility]. Pt voiced understanding.

## 2016-12-05 ENCOUNTER — Ambulatory Visit (INDEPENDENT_AMBULATORY_CARE_PROVIDER_SITE_OTHER): Payer: BLUE CROSS/BLUE SHIELD | Admitting: Primary Care

## 2016-12-05 ENCOUNTER — Encounter: Payer: Self-pay | Admitting: Primary Care

## 2016-12-05 VITALS — BP 118/74 | HR 56 | Temp 98.7°F | Ht 72.0 in | Wt 187.1 lb

## 2016-12-05 DIAGNOSIS — R41840 Attention and concentration deficit: Secondary | ICD-10-CM | POA: Diagnosis not present

## 2016-12-05 DIAGNOSIS — D170 Benign lipomatous neoplasm of skin and subcutaneous tissue of head, face and neck: Secondary | ICD-10-CM | POA: Diagnosis not present

## 2016-12-05 NOTE — Patient Instructions (Signed)
You will be contacted regarding your referral to Dermatology for evaluation of the lipoma and psychology for ADHD testing.  Please let us know if you have not heard back within one week.   I'll be in touch with you once I hear back from the GI doctor.  It was a pleasure to see you today!

## 2016-12-05 NOTE — Assessment & Plan Note (Signed)
Patient ready to have this removed, referral placed to dermatology per his request. He cannot afford to see general surgery.

## 2016-12-05 NOTE — Progress Notes (Signed)
Subjective:    Patient ID: Chris Petersen, male    DOB: 09/23/1970, 46 y.o.   MRN: [id number]  HPI  Mr. Bussiere is a 46 year old male who presents today to discuss multiple things.  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 During his last visit in May 2018 he was interested in having the lipoma surgically removed. His dermatologist attempted to remove the mass several years ago but was unsuccessful. He would like to see another dermatologist as he cannot afford to see general surgery.  2) Family history of Colon Cancer: Father diagnosed around the age of 67, underwent surgical colectomy and is doing well. He Underwent colonoscopy and endoscopy five years ago which was clear. He denies rectal bleeding, weight loss, night sweats. He is wondering if he needs to be rescreened for colon cancer.  3) Difficulty concentrating: Treated for ADHD in 2008, once managed on "extended release" medication and did well. He stopped taking this medication in 2010. Mostly took this medication during the work week. Symptoms include difficulty focusing and difficulty completing tasks at work. He is interested in restarting this medication.   Review of Systems  Constitutional: Negative for appetite change and unexpected weight change.  Respiratory: Negative for shortness of breath.   Cardiovascular: Negative for chest pain.  Gastrointestinal: Negative for blood in stool.  Skin:       Lipoma  Psychiatric/Behavioral: Positive for decreased concentration.       Past Medical History:  Diagnosis Date  . Arthritis   . GERD (gastroesophageal reflux disease)   . Hyperlipidemia      Social History   Social History  . Marital status: Married    Spouse name: N/A  . Number of children: N/A  . Years of education: N/A   Occupational History  . Not on file.   Social History Main Topics  . Smoking status: Never Smoker  . Smokeless tobacco: Never Used  . Alcohol use 0.0 oz/week   Comment: occ  . Drug use: No  . Sexual activity: Not on file   Other Topics Concern  . Not on file   Social History Narrative  . No narrative on file    Past Surgical History:  Procedure Laterality Date  . hand accident    . KNEE ARTHROCENTESIS     L Knee ( MCL, ACL,PCL ) tear  . VASECTOMY  2015    Family History  Problem Relation Age of Onset  . Cancer Father        colon  . Colon polyps Father   . Heart disease Father        2010    No Known Allergies  Current Outpatient Prescriptions on File Prior to Visit  Medication Sig Dispense Refill  . BIOTIN PO Take by mouth.    . clobetasol ointment (TEMOVATE) 1.66 % Apply 1 application topically 2 (two) times daily. 60 g 1  . Glucosamine HCl (GLUCOSAMINE PO) Take by mouth.    . Omega-3 Fatty Acids (FISH OIL CONCENTRATE PO) Take by mouth.    . pantoprazole (PROTONIX) 20 MG tablet Take 1 tablet (20 mg total) by mouth daily. 90 tablet 1  . triamcinolone cream (KENALOG) 0.1 % Apply 1 application topically 2 (two) times daily. 30 g 0  . scopolamine (TRANSDERM-SCOP) 1 MG/3DAYS Apply 1 patch to hairless area 8 hours prior to fishing trip. Replace every three days. (Patient not taking: Reported on 12/05/2016) 10 patch 1   No  current facility-administered medications on file prior to visit.     BP 118/74   Pulse (!) 56   Temp 98.7 F (37.1 C) (Oral)   Ht 6' (1.829 m)   Wt 187 lb 1.9 oz (84.9 kg)   SpO2 98%   BMI 25.38 kg/m    Objective:   Physical Exam  Constitutional: He appears well-nourished.  Neck: Neck supple.  Cardiovascular: Normal rate and regular rhythm.   Pulmonary/Chest: Effort normal and breath sounds normal. He has no wheezes. He has no rales.  Skin: Skin is warm and dry.  Psychiatric: He has a normal mood and affect.          Assessment & Plan:  Family history of colon cancer:  Colon cancer and father that was diagnosed at age 58. Patient has undergone colonoscopy at age 89, unremarkable without  polyps. No alarm signs today. Based off of current guidelines he has a first degree relative diagnosed with colon cancer after the age of 62, therefore repeat colonoscopy recommendation would be at age 30. Will contact GI for any other recommendations, otherwise repeat colonoscopy at 63.  Sheral Flow, NP

## 2016-12-05 NOTE — Assessment & Plan Note (Signed)
Endorses prior history of ADHD, no formal testing. Seems like he was once managed on extended release Adderall and did well. Will send him for formal testing with psychology for further evaluation.

## 2016-12-08 ENCOUNTER — Telehealth: Payer: Self-pay | Admitting: Primary Care

## 2016-12-08 NOTE — Telephone Encounter (Signed)
Please notify patient that I spoke with GI and they state that he will be due for screening colonoscopy at age 46 as long as his prior colonoscopy was clear (no polyps or other abnormalities).

## 2016-12-09 NOTE — Telephone Encounter (Signed)
Left detailed message on voicemail.  

## 2017-01-24 ENCOUNTER — Ambulatory Visit: Payer: BLUE CROSS/BLUE SHIELD | Admitting: Psychology

## 2017-02-18 ENCOUNTER — Other Ambulatory Visit: Payer: Self-pay | Admitting: Primary Care

## 2017-02-18 DIAGNOSIS — K219 Gastro-esophageal reflux disease without esophagitis: Secondary | ICD-10-CM

## 2017-04-03 DIAGNOSIS — D170 Benign lipomatous neoplasm of skin and subcutaneous tissue of head, face and neck: Secondary | ICD-10-CM | POA: Diagnosis not present

## 2017-05-22 ENCOUNTER — Other Ambulatory Visit: Payer: Self-pay | Admitting: Primary Care

## 2017-05-22 DIAGNOSIS — K219 Gastro-esophageal reflux disease without esophagitis: Secondary | ICD-10-CM

## 2017-05-31 ENCOUNTER — Other Ambulatory Visit: Payer: Self-pay | Admitting: Primary Care

## 2017-05-31 DIAGNOSIS — E785 Hyperlipidemia, unspecified: Secondary | ICD-10-CM

## 2017-06-08 ENCOUNTER — Other Ambulatory Visit: Payer: BLUE CROSS/BLUE SHIELD

## 2017-06-12 ENCOUNTER — Encounter: Payer: Self-pay | Admitting: Primary Care

## 2017-06-12 ENCOUNTER — Ambulatory Visit (INDEPENDENT_AMBULATORY_CARE_PROVIDER_SITE_OTHER): Payer: BLUE CROSS/BLUE SHIELD | Admitting: Primary Care

## 2017-06-12 ENCOUNTER — Encounter: Payer: Self-pay | Admitting: *Deleted

## 2017-06-12 ENCOUNTER — Other Ambulatory Visit (INDEPENDENT_AMBULATORY_CARE_PROVIDER_SITE_OTHER): Payer: BLUE CROSS/BLUE SHIELD

## 2017-06-12 VITALS — BP 118/76 | HR 62 | Temp 98.2°F | Ht 72.0 in | Wt 190.8 lb

## 2017-06-12 DIAGNOSIS — J219 Acute bronchiolitis, unspecified: Secondary | ICD-10-CM | POA: Diagnosis not present

## 2017-06-12 DIAGNOSIS — K219 Gastro-esophageal reflux disease without esophagitis: Secondary | ICD-10-CM

## 2017-06-12 DIAGNOSIS — E785 Hyperlipidemia, unspecified: Secondary | ICD-10-CM

## 2017-06-12 DIAGNOSIS — Z0001 Encounter for general adult medical examination with abnormal findings: Secondary | ICD-10-CM

## 2017-06-12 DIAGNOSIS — J22 Unspecified acute lower respiratory infection: Secondary | ICD-10-CM

## 2017-06-12 LAB — COMPREHENSIVE METABOLIC PANEL
ALT: 23 U/L (ref 0–53)
AST: 24 U/L (ref 0–37)
Albumin: 4.5 g/dL (ref 3.5–5.2)
Alkaline Phosphatase: 61 U/L (ref 39–117)
BILIRUBIN TOTAL: 0.7 mg/dL (ref 0.2–1.2)
BUN: 18 mg/dL (ref 6–23)
CO2: 30 meq/L (ref 19–32)
CREATININE: 1.06 mg/dL (ref 0.40–1.50)
Calcium: 10.4 mg/dL (ref 8.4–10.5)
Chloride: 98 mEq/L (ref 96–112)
GFR: 80.1 mL/min (ref >60.00)
GLUCOSE: 103 mg/dL — AB (ref 70–99)
Potassium: 4.4 mEq/L (ref 3.5–5.1)
Sodium: 136 mEq/L (ref 135–145)
TOTAL PROTEIN: 8.1 g/dL (ref 6.0–8.3)

## 2017-06-12 LAB — LIPID PANEL
CHOL/HDL RATIO: 4
Cholesterol: 191 mg/dL (ref 0–200)
HDL: 47.2 mg/dL (ref >39.00)
LDL Cholesterol: 128 mg/dL — ABNORMAL HIGH (ref 0–99)
NONHDL: 143.48
Triglycerides: 76 mg/dL (ref 0.0–149.0)
VLDL: 15.2 mg/dL (ref 0.0–40.0)

## 2017-06-12 MED ORDER — AZITHROMYCIN 250 MG PO TABS: ORAL_TABLET | ORAL | 0 refills | Status: DC

## 2017-06-12 NOTE — Assessment & Plan Note (Signed)
Immunizations UTD. Colon cancer screening due at age 47. Commended him on a healthy lifestyle, recommend to increase exercise. Exam with acute bronchitis, otherwise unremarkable. Labs pending. Follow up in 1 year.

## 2017-06-12 NOTE — Assessment & Plan Note (Signed)
Doing well on pantoprazole 20 mg, continue same.  

## 2017-06-12 NOTE — Patient Instructions (Signed)
Start Azithromycin antibiotics for infection. Take 2 tablets by mouth today, then 1 tablet daily for 4 additional days.  I'll be in touch once I receive your lab results.  Continue exercising. You should be getting 150 minutes of moderate intensity exercise weekly.  Continue to work on a healthy diet.  Ensure you are consuming 64 ounces of water daily.  Follow up in 1 year for your annual exam or sooner if needed.  It was a pleasure to see you today!   Preventive Care 40-64 Years, Male Preventive care refers to lifestyle choices and visits with your health care provider that can promote health and wellness. What does preventive care include?  A yearly physical exam. This is also called an annual well check.  Dental exams once or twice a year.  Routine eye exams. Ask your health care provider how often you should have your eyes checked.  Personal lifestyle choices, including: ? Daily care of your teeth and gums. ? Regular physical activity. ? Eating a healthy diet. ? Avoiding tobacco and drug use. ? Limiting alcohol use. ? Practicing safe sex. ? Taking low-dose aspirin every day starting at age 67. What happens during an annual well check? The services and screenings done by your health care provider during your annual well check will depend on your age, overall health, lifestyle risk factors, and family history of disease. Counseling Your health care provider may ask you questions about your:  Alcohol use.  Tobacco use.  Drug use.  Emotional well-being.  Home and relationship well-being.  Sexual activity.  Eating habits.  Work and work Statistician.  Screening You may have the following tests or measurements:  Height, weight, and BMI.  Blood pressure.  Lipid and cholesterol levels. These may be checked every 5 years, or more frequently if you are over 30 years old.  Skin check.  Lung cancer screening. You may have this screening every year starting at  age 42 if you have a 30-pack-year history of smoking and currently smoke or have quit within the past 15 years.  Fecal occult blood test (FOBT) of the stool. You may have this test every year starting at age 80.  Flexible sigmoidoscopy or colonoscopy. You may have a sigmoidoscopy every 5 years or a colonoscopy every 10 years starting at age 38.  Prostate cancer screening. Recommendations will vary depending on your family history and other risks.  Hepatitis C blood test.  Hepatitis B blood test.  Sexually transmitted disease (STD) testing.  Diabetes screening. This is done by checking your blood sugar (glucose) after you have not eaten for a while (fasting). You may have this done every 1-3 years.  Discuss your test results, treatment options, and if necessary, the need for more tests with your health care provider. Vaccines Your health care provider may recommend certain vaccines, such as:  Influenza vaccine. This is recommended every year.  Tetanus, diphtheria, and acellular pertussis (Tdap, Td) vaccine. You may need a Td booster every 10 years.  Varicella vaccine. You may need this if you have not been vaccinated.  Zoster vaccine. You may need this after age 53.  Measles, mumps, and rubella (MMR) vaccine. You may need at least one dose of MMR if you were born in 1957 or later. You may also need a second dose.  Pneumococcal 13-valent conjugate (PCV13) vaccine. You may need this if you have certain conditions and have not been vaccinated.  Pneumococcal polysaccharide (PPSV23) vaccine. You may need one or two doses  if you smoke cigarettes or if you have certain conditions.  Meningococcal vaccine. You may need this if you have certain conditions.  Hepatitis A vaccine. You may need this if you have certain conditions or if you travel or work in places where you may be exposed to hepatitis A.  Hepatitis B vaccine. You may need this if you have certain conditions or if you travel or  work in places where you may be exposed to hepatitis B.  Haemophilus influenzae type b (Hib) vaccine. You may need this if you have certain risk factors.  Talk to your health care provider about which screenings and vaccines you need and how often you need them. This information is not intended to replace advice given to you by your health care provider. Make sure you discuss any questions you have with your health care provider. Document Released: 03/27/2015 Document Revised: 11/18/2015 Document Reviewed: 12/30/2014 Elsevier Interactive Patient Education  [employer].

## 2017-06-12 NOTE — Progress Notes (Addendum)
Subjective:    Patient ID: Chris Petersen, male    DOB: 13-May-1970, 47 y.o.   MRN: [id number]  HPI  Mr. Chris Petersen is a 47 year old male who presents today for complete physical.  Immunizations: -Tetanus: Completed in 2015 -Influenza: Completed in 3 months ago.    Diet: He endorses a healthy diet Breakfast: Toast, fruit, avocado,  Lunch: Salad, lettuce wraps Dinner: Meat, vegetable  Snacks:Rarely, nuts Desserts: Rarely  Beverages: Water, cranberry juice, Coke Zero, orange juice, coffee  Exercise: He is exercising twice weekly Eye exam: Completed 6 months ago  Dental exam: Completes semi-annually   Endorses a 10 day history of sinus pressure, cough, chest congestion, fatigue. He's also expelling thick green mucous from his nasal cavity and also from his chest. He's taken a small amount of sudafed without improvement. His wife and son have been ill with similar symptoms.   Review of Systems  Constitutional: Positive for chills. Negative for fever.  HENT: Positive for congestion and sinus pressure.   Respiratory: Positive for cough.   Cardiovascular: Negative for chest pain.  Gastrointestinal: Negative for constipation and diarrhea.  Genitourinary: Negative for difficulty urinating.  Musculoskeletal: Positive for myalgias.  Skin: Negative for color change.  Neurological: Positive for headaches.  Psychiatric/Behavioral: The patient is not nervous/anxious.        Past Medical History:  Diagnosis Date  . Arthritis   . GERD (gastroesophageal reflux disease)   . Hyperlipidemia      Social History   Socioeconomic History  . Marital status: Married    Spouse name: Not on file  . Number of children: Not on file  . Years of education: Not on file  . Highest education level: Not on file  Occupational History  . Not on file  Social Needs  . Financial resource strain: Not on file  . Food insecurity:    Worry: Not on file    Inability: Not on file  . Transportation  needs:    Medical: Not on file    Non-medical: Not on file  Tobacco Use  . Smoking status: Never Smoker  . Smokeless tobacco: Never Used  Substance and Sexual Activity  . Alcohol use: Yes    Alcohol/week: 0.0 oz    Comment: occ  . Drug use: No  . Sexual activity: Not on file  Lifestyle  . Physical activity:    Days per week: Not on file    Minutes per session: Not on file  . Stress: Not on file  Relationships  . Social connections:    Talks on phone: Not on file    Gets together: Not on file    Attends religious service: Not on file    Active member of club or organization: Not on file    Attends meetings of clubs or organizations: Not on file    Relationship status: Not on file  . Intimate partner violence:    Fear of current or ex partner: Not on file    Emotionally abused: Not on file    Physically abused: Not on file    Forced sexual activity: Not on file  Other Topics Concern  . Not on file  Social History Narrative  . Not on file    Past Surgical History:  Procedure Laterality Date  . hand accident    . KNEE ARTHROCENTESIS     L Knee ( MCL, ACL,PCL ) tear  . VASECTOMY  2015    Family History  Problem Relation Age  of Onset  . Colon polyps Father   . Heart disease Father        2010  . Colon cancer Father 65    No Known Allergies  Current Outpatient Medications on File Prior to Visit  Medication Sig Dispense Refill  . BIOTIN PO Take by mouth.    . clobetasol ointment (TEMOVATE) 8.75 % Apply 1 application topically 2 (two) times daily. 60 g 1  . Glucosamine HCl (GLUCOSAMINE PO) Take by mouth.    . Omega-3 Fatty Acids (FISH OIL CONCENTRATE PO) Take by mouth.    . pantoprazole (PROTONIX) 20 MG tablet TAKE 1 TABLET BY MOUTH EVERY DAY 90 tablet 1  . scopolamine (TRANSDERM-SCOP) 1 MG/3DAYS Apply 1 patch to hairless area 8 hours prior to fishing trip. Replace every three days. 10 patch 1  . triamcinolone cream (KENALOG) 0.1 % Apply 1 application topically 2  (two) times daily. (Patient not taking: Reported on 06/12/2017) 30 g 0   No current facility-administered medications on file prior to visit.     BP 118/76   Pulse 62   Temp 98.2 F (36.8 C) (Oral)   Ht 6' (1.829 m)   Wt 190 lb 12 oz (86.5 kg)   SpO2 99%   BMI 25.87 kg/m    Objective:   Physical Exam  Constitutional: He is oriented to person, place, and time. He appears well-nourished.  HENT:  Right Ear: Tympanic membrane and ear canal normal.  Left Ear: Tympanic membrane and ear canal normal.  Nose: Nose normal. Right sinus exhibits no maxillary sinus tenderness and no frontal sinus tenderness. Left sinus exhibits no maxillary sinus tenderness and no frontal sinus tenderness.  Mouth/Throat: Oropharynx is clear and moist.  Eyes: Pupils are equal, round, and reactive to light. Conjunctivae and EOM are normal.  Neck: Neck supple. Carotid bruit is not present. No thyromegaly present.  Cardiovascular: Normal rate, regular rhythm and normal heart sounds.  Pulmonary/Chest: Effort normal. He has no decreased breath sounds. He has wheezes in the right upper field and the left upper field. He has rhonchi in the right upper field, the right lower field and the left upper field. He has no rales.  Abdominal: Soft. Bowel sounds are normal. There is no tenderness.  Musculoskeletal: Normal range of motion.  Neurological: He is alert and oriented to person, place, and time. He has normal reflexes. No cranial nerve deficit.  Skin: Skin is warm and dry.  Psychiatric: He has a normal mood and affect.          Assessment & Plan:  Acute Bronchitis:  Cough, congestion, expulsion of thick green mucous from chest and nasal cavity. Symptoms present for 10 days. Exam today suspicious for acute bronchitis/bacterial involvement. Rx for Zpak sent to pharmacy.  Fluids, rest, follow up PRN.  Pleas Koch, NP

## 2017-06-12 NOTE — Assessment & Plan Note (Signed)
Repeat lipids pending today. Commended him on healthy lifestyle, recommended to increase exercise.

## 2017-06-16 ENCOUNTER — Telehealth: Payer: Self-pay | Admitting: Primary Care

## 2017-06-16 DIAGNOSIS — J209 Acute bronchitis, unspecified: Secondary | ICD-10-CM

## 2017-06-16 MED ORDER — AMOXICILLIN-POT CLAVULANATE 875-125 MG PO TABS: 1.0000 | ORAL_TABLET | Freq: Two times a day (BID) | ORAL | 0 refills | Status: DC

## 2017-06-16 NOTE — Telephone Encounter (Signed)
Spoken and notified patient of Kate's comments. Patient stated that his cough is not that much better. He has chest congestion, productive cough, and still some nasal congestion. Patient stated that he feels about 10 % better.

## 2017-06-16 NOTE — Telephone Encounter (Signed)
Please call patient and ask about his symptoms. (wheezing, cough, chest congestion, fevers, shortness of breath?) Did he get any better after the antibiotics? The antibiotics we provided are very strong and if his infection was bacterial it would have helped.

## 2017-06-16 NOTE — Telephone Encounter (Signed)
Please notify patient that I'll send in additional antibiotics. If no improvement then he will need an office visit. Start Augmentin antibiotics for the infection Take 1 tablet by mouth twice daily for 7 days.

## 2017-06-16 NOTE — Telephone Encounter (Signed)
Spoken and notified patient of Kate's comments. Patient verbalized understanding. 

## 2017-06-16 NOTE — Telephone Encounter (Signed)
Copied from [place]. Topic: Quick Communication - See Telephone Encounter >> Jun 16, 2017 10:49 AM Robina Ade, Helene Kelp D wrote: CRM for notification. See Telephone encounter for: 06/16/17. Patient called back and said that the antibiotic given to him as not work and does not know what else todo. He would like to talk to [facility] or her CMA about this problem. Please call patient back, thanks.

## 2017-09-02 ENCOUNTER — Other Ambulatory Visit: Payer: Self-pay

## 2017-09-02 ENCOUNTER — Encounter: Payer: Self-pay | Admitting: Emergency Medicine

## 2017-09-02 ENCOUNTER — Emergency Department: Payer: BLUE CROSS/BLUE SHIELD

## 2017-09-02 ENCOUNTER — Emergency Department
Admission: EM | Admit: 2017-09-02 | Discharge: 2017-09-02 | Disposition: A | Discharge status: Home / Self Care | Payer: BLUE CROSS/BLUE SHIELD | Attending: Emergency Medicine | Admitting: Emergency Medicine

## 2017-09-02 DIAGNOSIS — S4991XA Unspecified injury of right shoulder and upper arm, initial encounter: Secondary | ICD-10-CM | POA: Diagnosis not present

## 2017-09-02 DIAGNOSIS — S43101A Unspecified dislocation of right acromioclavicular joint, initial encounter: Secondary | ICD-10-CM | POA: Diagnosis not present

## 2017-09-02 DIAGNOSIS — Y9389 Activity, other specified: Secondary | ICD-10-CM | POA: Diagnosis not present

## 2017-09-02 DIAGNOSIS — Y929 Unspecified place or not applicable: Secondary | ICD-10-CM | POA: Insufficient documentation

## 2017-09-02 DIAGNOSIS — M25531 Pain in right wrist: Secondary | ICD-10-CM | POA: Diagnosis not present

## 2017-09-02 DIAGNOSIS — Z79899 Other long term (current) drug therapy: Secondary | ICD-10-CM | POA: Insufficient documentation

## 2017-09-02 DIAGNOSIS — Y998 Other external cause status: Secondary | ICD-10-CM | POA: Diagnosis not present

## 2017-09-02 DIAGNOSIS — S6991XA Unspecified injury of right wrist, hand and finger(s), initial encounter: Secondary | ICD-10-CM | POA: Diagnosis not present

## 2017-09-02 DIAGNOSIS — S40011A Contusion of right shoulder, initial encounter: Secondary | ICD-10-CM | POA: Insufficient documentation

## 2017-09-02 MED ORDER — KETOROLAC TROMETHAMINE 30 MG/ML IJ SOLN
30.0000 mg | Freq: Once | INTRAMUSCULAR | Status: AC
  Administered 2017-09-02: 30 mg via INTRAMUSCULAR
  Filled 2017-09-02: qty 1

## 2017-09-02 MED ORDER — MELOXICAM 15 MG PO TABS: 15.0000 mg | ORAL_TABLET | Freq: Every day | ORAL | 1 refills | Status: AC

## 2017-09-02 MED ORDER — CYCLOBENZAPRINE HCL 10 MG PO TABS: 10.0000 mg | ORAL_TABLET | Freq: Three times a day (TID) | ORAL | 0 refills | Status: AC | PRN
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
## 2017-11-16 ENCOUNTER — Ambulatory Visit: Payer: BLUE CROSS/BLUE SHIELD | Admitting: Family Medicine

## 2017-11-16 ENCOUNTER — Encounter: Payer: Self-pay | Admitting: Family Medicine

## 2017-11-16 VITALS — BP 136/74 | HR 68 | Temp 98.1°F | Ht 72.0 in | Wt 196.0 lb

## 2017-11-16 DIAGNOSIS — R197 Diarrhea, unspecified: Secondary | ICD-10-CM | POA: Diagnosis not present

## 2017-11-16 NOTE — Patient Instructions (Signed)
Drink a lot of fluids  Also more rest  Take a little time off from exercise to get better   I think the bleeding is from an irritated rectum from the diarrhea   If diarrhea worsens, if abdominal pain , if more blood - please let us know  Dehydration is a risk - watch for a rapid heartbeat or dry mouth   Update if not starting to improve in a week or if worsening

## 2017-11-16 NOTE — Assessment & Plan Note (Signed)
For 2-3 days with frequent watery bm /no other symptoms  (suspect viral)  Some rectal bleeding since last night -I suspect from rectal irritation per exam  Disc bland diet/fluids to prevent dehydration  Enc him to rest and not exercise aggressively as he has been to give his body a chance to recover  immodium once per day -with caution only as needed Watch for abd pain/fever/rectal pain or worse diarrhea and rev s/s of dehydration as well  Do not recommend proctosol or anusol due to fear of mucosal breakdown  Consider further GI eval if no improvement
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
## 2017-12-21 ENCOUNTER — Other Ambulatory Visit: Payer: Self-pay | Admitting: Primary Care

## 2017-12-21 DIAGNOSIS — K219 Gastro-esophageal reflux disease without esophagitis: Secondary | ICD-10-CM

## 2018-02-05 ENCOUNTER — Other Ambulatory Visit: Payer: Self-pay | Admitting: Primary Care

## 2018-02-05 DIAGNOSIS — L309 Dermatitis, unspecified: Secondary | ICD-10-CM

## 2018-02-05 MED ORDER — CLOBETASOL PROPIONATE 0.05 % EX OINT: 1.0000 "application " | TOPICAL_OINTMENT | Freq: Two times a day (BID) | CUTANEOUS | 0 refills | Status: AC

## 2018-02-05 NOTE — Telephone Encounter (Signed)
Did received fax. However, will need to send to [facility] for approval. Last prescribed on 11/16/2016. Last seen on 11/16/2017 with Dr Glori Bickers for acute.

## 2018-02-05 NOTE — Telephone Encounter (Signed)
Kendra/[facility] called office to confirm if the refill request they faxed for the Clobetasol ointment has been received. The Rx has expired.

## 2018-02-05 NOTE — Telephone Encounter (Signed)
Noted, refill sent to pharmacy. 

## 2018-02-07 ENCOUNTER — Telehealth: Payer: Self-pay | Admitting: Primary Care

## 2018-02-07 ENCOUNTER — Encounter: Payer: Self-pay | Admitting: Primary Care

## 2018-02-07 DIAGNOSIS — Z1211 Encounter for screening for malignant neoplasm of colon: Secondary | ICD-10-CM

## 2018-02-07 DIAGNOSIS — M24811 Other specific joint derangements of right shoulder, not elsewhere classified: Secondary | ICD-10-CM | POA: Diagnosis not present

## 2018-02-07 NOTE — Telephone Encounter (Signed)
Pt called to request a referral for colonoscopy. He had 1 at age 47 in [place], [place] due to a family history of colon cancer. He is currently not having any symptoms but would like to get a scond one. He thinks he was told to follow up in 5 yrs so he is overdue. Last cpe completed 06/12/17.

## 2018-02-07 NOTE — Telephone Encounter (Signed)
Please notify patient that the referral was placed to GI for the colonoscopy as requested.

## 2018-02-12 NOTE — Telephone Encounter (Signed)
Spoken and notified patient of Kate Clark's comments. Patient verbalized understanding.  

## 2018-02-27 ENCOUNTER — Encounter: Payer: Self-pay | Admitting: Primary Care

## 2018-02-28 DIAGNOSIS — Z800 Family history of malignant neoplasm of digestive organs: Secondary | ICD-10-CM | POA: Diagnosis not present

## 2018-02-28 DIAGNOSIS — K922 Gastrointestinal hemorrhage, unspecified: Secondary | ICD-10-CM | POA: Diagnosis not present

## 2018-03-16 DIAGNOSIS — K5730 Diverticulosis of large intestine without perforation or abscess without bleeding: Secondary | ICD-10-CM | POA: Diagnosis not present

## 2018-03-16 DIAGNOSIS — D125 Benign neoplasm of sigmoid colon: Secondary | ICD-10-CM | POA: Diagnosis not present

## 2018-03-16 DIAGNOSIS — K921 Melena: Secondary | ICD-10-CM | POA: Diagnosis not present

## 2018-05-04 DIAGNOSIS — M25561 Pain in right knee: Secondary | ICD-10-CM | POA: Diagnosis not present

## 2018-07-12 DIAGNOSIS — F900 Attention-deficit hyperactivity disorder, predominantly inattentive type: Secondary | ICD-10-CM | POA: Diagnosis not present

## 2018-08-02 ENCOUNTER — Telehealth: Payer: Self-pay | Admitting: Primary Care

## 2018-08-02 NOTE — Telephone Encounter (Signed)
Patient has recently moved out of [facility].   He would like to know what his cholesterol level was from his last lab visit in 2019  PHONE- [phone]

## 2018-08-03 NOTE — Telephone Encounter (Signed)
Given the information as requested.

## 2018-08-05 ENCOUNTER — Other Ambulatory Visit: Payer: Self-pay | Admitting: Primary Care

## 2018-08-05 DIAGNOSIS — K219 Gastro-esophageal reflux disease without esophagitis: Secondary | ICD-10-CM

## 2018-09-13 DIAGNOSIS — K921 Melena: Secondary | ICD-10-CM | POA: Diagnosis not present

## 2018-09-13 DIAGNOSIS — R140 Abdominal distension (gaseous): Secondary | ICD-10-CM | POA: Diagnosis not present

## 2018-09-13 DIAGNOSIS — K2900 Acute gastritis without bleeding: Secondary | ICD-10-CM | POA: Diagnosis not present

## 2018-09-13 DIAGNOSIS — R142 Eructation: Secondary | ICD-10-CM | POA: Diagnosis not present

## 2018-10-01 DIAGNOSIS — F1990 Other psychoactive substance use, unspecified, uncomplicated: Secondary | ICD-10-CM | POA: Diagnosis not present

## 2018-10-01 DIAGNOSIS — R1011 Right upper quadrant pain: Secondary | ICD-10-CM | POA: Diagnosis not present

## 2018-10-01 DIAGNOSIS — R110 Nausea: Secondary | ICD-10-CM | POA: Diagnosis not present

## 2018-10-01 DIAGNOSIS — K210 Gastro-esophageal reflux disease with esophagitis: Secondary | ICD-10-CM | POA: Diagnosis not present

## 2018-10-03 DIAGNOSIS — R110 Nausea: Secondary | ICD-10-CM | POA: Diagnosis not present

## 2018-10-03 DIAGNOSIS — R1011 Right upper quadrant pain: Secondary | ICD-10-CM | POA: Diagnosis not present

## 2018-10-04 DIAGNOSIS — K3189 Other diseases of stomach and duodenum: Secondary | ICD-10-CM | POA: Diagnosis not present

## 2018-10-04 DIAGNOSIS — R1011 Right upper quadrant pain: Secondary | ICD-10-CM | POA: Diagnosis not present

## 2018-10-04 DIAGNOSIS — K317 Polyp of stomach and duodenum: Secondary | ICD-10-CM | POA: Diagnosis not present

## 2018-10-11 ENCOUNTER — Encounter: Payer: Self-pay | Admitting: Primary Care

## 2018-10-29 ENCOUNTER — Telehealth: Payer: Self-pay

## 2018-10-29 NOTE — Telephone Encounter (Signed)
The labs pt has now is the same labs that chan found on care everywhere; pt thinks more labs were done and will ck on that and fax to [phone]. Pt notified as instructed and pt will cb on 10/30/18 to schedule CPX . FYI to [place].

## 2018-10-29 NOTE — Telephone Encounter (Signed)
Care Everywhere Result Report  Lipid Panel + Non-HDL CholesterolResulted: 10/04/2018 5:35 AM [facility] Component Name Value Ref Range  Cholesterol, Total 214 (H) 100 - 199 mg/dL  Triglycerides 119 0 - 149 mg/dL  HDL 53 >39 mg/dL  VLDL Cholesterol Cal 24 5 - 40 mg/dL  LDL 137 (H) 0 - 99 mg/dL  Comment Comment  Comment: According to 2001 ATP III Guidelines Risk Category:            Risk Category      LDL Goal                        (mg/dL)           0-1 risk factor       <160           2+ risk factor       <130          (10 year risk <20%)           CHD or CHD risk       <100   NON-HDL CHOLESTEROL 161 (H) 0 - 129 mg/dL  Comment Comment  Comment: Reference interval reflects a goal that is 30 mg/dL higher than the corresponding LDL-C goal.   Specimen Collected on  Blood 10/03/2018 8:28 AM  Result Narrative  Performed at: [facility] [address], [place], [place] [id number] Lab Director: Rush Farmer MD, Phone: [phone]

## 2018-10-29 NOTE — Telephone Encounter (Signed)
This is all I could find.

## 2018-10-29 NOTE — Telephone Encounter (Signed)
The 10-year ASCVD risk score Mikey Bussing DC Brooke Bonito., et al., 2013) is: 3.1%   Values used to calculate the score:     Age: 48 years     Sex: Male     Is Non-Hispanic African American: No     Diabetic: No     Tobacco smoker: No     Systolic Blood Pressure: 628 mmHg     Is BP treated: No     HDL Cholesterol: 47.2 mg/dL     Total Cholesterol: 214 mg/dL  Please notify patient that I'm happy to meet with him to discuss his labs. Have him fax [place] any additional labs that were drawn so we can review during his CPE.

## 2018-10-29 NOTE — Telephone Encounter (Signed)
I spoke with pt; pt just had upper GI;and pt had fasting lipids done thru [facility]; pt said cholesterol was high and does not know values; pt would like to get started on a statin; pt said is tied up at this moment but will cb in couple of hrs with lipid values and will schedule appt for annual exam. FYI to Gentry Fitz NP and Vallarie Mare CMA. [facility].

## 2018-10-29 NOTE — Telephone Encounter (Signed)
Noted. Could not find labs through [place].

## 2018-12-04 DIAGNOSIS — L209 Atopic dermatitis, unspecified: Secondary | ICD-10-CM | POA: Diagnosis not present

## 2018-12-04 DIAGNOSIS — D225 Melanocytic nevi of trunk: Secondary | ICD-10-CM | POA: Diagnosis not present

## 2018-12-04 DIAGNOSIS — D485 Neoplasm of uncertain behavior of skin: Secondary | ICD-10-CM | POA: Diagnosis not present

## 2018-12-04 DIAGNOSIS — D2362 Other benign neoplasm of skin of left upper limb, including shoulder: Secondary | ICD-10-CM | POA: Diagnosis not present

## 2018-12-27 DIAGNOSIS — M25561 Pain in right knee: Secondary | ICD-10-CM | POA: Diagnosis not present

## 2018-12-27 DIAGNOSIS — M25562 Pain in left knee: Secondary | ICD-10-CM | POA: Diagnosis not present

## 2018-12-27 DIAGNOSIS — M1712 Unilateral primary osteoarthritis, left knee: Secondary | ICD-10-CM | POA: Diagnosis not present

## 2019-01-03 ENCOUNTER — Other Ambulatory Visit: Payer: Self-pay | Admitting: Primary Care

## 2019-01-03 DIAGNOSIS — K219 Gastro-esophageal reflux disease without esophagitis: Secondary | ICD-10-CM

## 2019-01-18 DIAGNOSIS — F900 Attention-deficit hyperactivity disorder, predominantly inattentive type: Secondary | ICD-10-CM | POA: Diagnosis not present

## 2019-04-11 DIAGNOSIS — Z03818 Encounter for observation for suspected exposure to other biological agents ruled out: Secondary | ICD-10-CM | POA: Diagnosis not present

## 2019-04-11 DIAGNOSIS — Z20828 Contact with and (suspected) exposure to other viral communicable diseases: Secondary | ICD-10-CM | POA: Diagnosis not present

## 2019-04-11 DIAGNOSIS — R5383 Other fatigue: Secondary | ICD-10-CM | POA: Diagnosis not present

## 2019-04-14 DIAGNOSIS — Z20828 Contact with and (suspected) exposure to other viral communicable diseases: Secondary | ICD-10-CM | POA: Diagnosis not present

## 2019-06-26 ENCOUNTER — Other Ambulatory Visit: Payer: Self-pay | Admitting: Primary Care

## 2019-06-26 DIAGNOSIS — K219 Gastro-esophageal reflux disease without esophagitis: Secondary | ICD-10-CM

## 2019-07-09 ENCOUNTER — Other Ambulatory Visit: Payer: Self-pay | Admitting: Primary Care

## 2019-07-09 DIAGNOSIS — L309 Dermatitis, unspecified: Secondary | ICD-10-CM

## 2019-07-25 DIAGNOSIS — M1712 Unilateral primary osteoarthritis, left knee: Secondary | ICD-10-CM | POA: Diagnosis not present
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
## 2019-08-24 DIAGNOSIS — Z719 Counseling, unspecified: Secondary | ICD-10-CM | POA: Diagnosis not present

## 2019-08-24 DIAGNOSIS — S51812A Laceration without foreign body of left forearm, initial encounter: Secondary | ICD-10-CM | POA: Diagnosis not present

## 2019-09-05 DIAGNOSIS — F900 Attention-deficit hyperactivity disorder, predominantly inattentive type: Secondary | ICD-10-CM | POA: Diagnosis not present

## 2019-09-11 DIAGNOSIS — B079 Viral wart, unspecified: Secondary | ICD-10-CM | POA: Diagnosis not present

## 2019-09-11 DIAGNOSIS — D225 Melanocytic nevi of trunk: Secondary | ICD-10-CM | POA: Diagnosis not present

## 2019-10-07 DIAGNOSIS — B079 Viral wart, unspecified: Secondary | ICD-10-CM | POA: Diagnosis not present

## 2019-11-04 DIAGNOSIS — B079 Viral wart, unspecified: Secondary | ICD-10-CM | POA: Diagnosis not present

## 2019-11-04 DIAGNOSIS — L918 Other hypertrophic disorders of the skin: Secondary | ICD-10-CM | POA: Diagnosis not present

## 2019-11-04 DIAGNOSIS — L821 Other seborrheic keratosis: Secondary | ICD-10-CM | POA: Diagnosis not present

## 2019-12-10 DIAGNOSIS — B079 Viral wart, unspecified: Secondary | ICD-10-CM | POA: Diagnosis not present

## 2019-12-10 DIAGNOSIS — L209 Atopic dermatitis, unspecified: Secondary | ICD-10-CM | POA: Diagnosis not present

## 2019-12-10 DIAGNOSIS — D225 Melanocytic nevi of trunk: Secondary | ICD-10-CM | POA: Diagnosis not present

## 2020-02-12 DIAGNOSIS — E785 Hyperlipidemia, unspecified: Secondary | ICD-10-CM | POA: Diagnosis not present

## 2020-02-12 DIAGNOSIS — Z6827 Body mass index (BMI) 27.0-27.9, adult: Secondary | ICD-10-CM | POA: Diagnosis not present

## 2020-02-12 DIAGNOSIS — L649 Androgenic alopecia, unspecified: Secondary | ICD-10-CM | POA: Diagnosis not present

## 2020-02-12 DIAGNOSIS — Z0000 Encounter for general adult medical examination without abnormal findings: Secondary | ICD-10-CM | POA: Diagnosis not present
